# Patient Record
Sex: Female | Born: 1985
Health system: Southern US, Community
[De-identification: ages and names within clinical notes are randomized; demographics above are authoritative.]

## PROBLEM LIST (undated history)

## (undated) DIAGNOSIS — B009 Herpesviral infection, unspecified: Secondary | ICD-10-CM

## (undated) HISTORY — DX: Herpesviral infection, unspecified: B00.9

---

## 2003-05-23 ENCOUNTER — Emergency Department (HOSPITAL_COMMUNITY): Admission: EM | Admit: 2003-05-23 | Discharge: 2003-05-23 | Payer: Self-pay | Admitting: Emergency Medicine

## 2009-01-02 ENCOUNTER — Emergency Department (HOSPITAL_COMMUNITY): Admission: EM | Admit: 2009-01-02 | Discharge: 2009-01-02 | Payer: Self-pay | Admitting: Emergency Medicine

## 2010-07-21 LAB — POCT URINALYSIS DIP (DEVICE)
Bilirubin Urine: NEGATIVE
Glucose, UA: NEGATIVE mg/dL
Nitrite: NEGATIVE
Specific Gravity, Urine: 1.015 (ref 1.005–1.030)
Urobilinogen, UA: 0.2 mg/dL (ref 0.0–1.0)

## 2010-07-21 LAB — POCT PREGNANCY, URINE: Preg Test, Ur: NEGATIVE

## 2015-03-26 ENCOUNTER — Encounter (HOSPITAL_COMMUNITY): Payer: Self-pay

## 2015-03-26 ENCOUNTER — Emergency Department (HOSPITAL_COMMUNITY)
Admission: EM | Admit: 2015-03-26 | Discharge: 2015-03-26 | Disposition: A | Discharge status: Home / Self Care | Payer: BC Managed Care – PPO | Attending: Emergency Medicine | Admitting: Emergency Medicine

## 2015-03-26 DIAGNOSIS — S3992XA Unspecified injury of lower back, initial encounter: Secondary | ICD-10-CM | POA: Diagnosis present

## 2015-03-26 DIAGNOSIS — Y92410 Unspecified street and highway as the place of occurrence of the external cause: Secondary | ICD-10-CM | POA: Diagnosis not present

## 2015-03-26 DIAGNOSIS — Y9389 Activity, other specified: Secondary | ICD-10-CM | POA: Insufficient documentation

## 2015-03-26 DIAGNOSIS — Y998 Other external cause status: Secondary | ICD-10-CM | POA: Insufficient documentation

## 2015-03-26 DIAGNOSIS — S39012A Strain of muscle, fascia and tendon of lower back, initial encounter: Secondary | ICD-10-CM | POA: Diagnosis not present
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
## 2015-03-26 NOTE — Discharge Instructions (Signed)
Cryotherapy °Cryotherapy means treatment with cold. Ice or gel packs can be used to reduce both pain and swelling. Ice is the most helpful within the first 24 to 48 hours after an injury or flare-up from overusing a muscle or joint. Sprains, strains, spasms, burning pain, shooting pain, and aches can all be eased with ice. Ice can also be used when recovering from surgery. Ice is effective, has very few side effects, and is safe for most people to use. °PRECAUTIONS  °Ice is not a safe treatment option for people with: °· Raynaud phenomenon. This is a condition affecting small blood vessels in the extremities. Exposure to cold may cause your problems to return. °· Cold hypersensitivity. There are many forms of cold hypersensitivity, including: °· Cold urticaria. Red, itchy hives appear on the skin when the tissues begin to warm after being iced. °· Cold erythema. This is a red, itchy rash caused by exposure to cold. °· Cold hemoglobinuria. Red blood cells break down when the tissues begin to warm after being iced. The hemoglobin that carry oxygen are passed into the urine because they cannot combine with blood proteins fast enough. °· Numbness or altered sensitivity in the area being iced. °If you have any of the following conditions, do not use ice until you have discussed cryotherapy with your caregiver: °· Heart conditions, such as arrhythmia, angina, or chronic heart disease. °· High blood pressure. °· Healing wounds or open skin in the area being iced. °· Current infections. °· Rheumatoid arthritis. °· Poor circulation. °· Diabetes. °Ice slows the blood flow in the region it is applied. This is beneficial when trying to stop inflamed tissues from spreading irritating chemicals to surrounding tissues. However, if you expose your skin to cold temperatures for too long or without the proper protection, you can damage your skin or nerves. Watch for signs of skin damage due to cold. °HOME CARE INSTRUCTIONS °Follow  these tips to use ice and cold packs safely. °· Place a dry or damp towel between the ice and skin. A damp towel will cool the skin more quickly, so you may need to shorten the time that the ice is used. °· For a more rapid response, add gentle compression to the ice. °· Ice for no more than 10 to 20 minutes at a time. The bonier the area you are icing, the less time it will take to get the benefits of ice. °· Check your skin after 5 minutes to make sure there are no signs of a poor response to cold or skin damage. °· Rest 20 minutes or more between uses. °· Once your skin is numb, you can end your treatment. You can test numbness by very lightly touching your skin. The touch should be so light that you do not see the skin dimple from the pressure of your fingertip. When using ice, most people will feel these normal sensations in this order: cold, burning, aching, and numbness. °· Do not use ice on someone who cannot communicate their responses to pain, such as small children or people with dementia. °HOW TO MAKE AN ICE PACK °Ice packs are the most common way to use ice therapy. Other methods include ice massage, ice baths, and cryosprays. Muscle creams that cause a cold, tingly feeling do not offer the same benefits that ice offers and should not be used as a substitute unless recommended by your caregiver. °To make an ice pack, do one of the following: °· Place crushed ice or a   bag of frozen vegetables in a sealable plastic bag. Squeeze out the excess air. Place this bag inside another plastic bag. Slide the bag into a pillowcase or place a damp towel between your skin and the bag. °· Mix 3 parts water with 1 part rubbing alcohol. Freeze the mixture in a sealable plastic bag. When you remove the mixture from the freezer, it will be slushy. Squeeze out the excess air. Place this bag inside another plastic bag. Slide the bag into a pillowcase or place a damp towel between your skin and the bag. °SEEK MEDICAL CARE  IF: °· You develop white spots on your skin. This may give the skin a blotchy (mottled) appearance. °· Your skin turns blue or pale. °· Your skin becomes waxy or hard. °· Your swelling gets worse. °MAKE SURE YOU:  °· Understand these instructions. °· Will watch your condition. °· Will get help right away if you are not doing well or get worse. °  °This information is not intended to replace advice given to you by your health care provider. Make sure you discuss any questions you have with your health care provider. °  °Document Released: 11/27/2010 Document Revised: 04/23/2014 Document Reviewed: 11/27/2010 °Elsevier Interactive Patient Education ©2016 [employer] Inc. ° °Muscle Strain °A muscle strain is an injury that occurs when a muscle is stretched beyond its normal length. Usually a small number of muscle fibers are torn when this happens. Muscle strain is rated in degrees. First-degree strains have the least amount of muscle fiber tearing and pain. Second-degree and third-degree strains have increasingly more tearing and pain.  °Usually, recovery from muscle strain takes 1-2 weeks. Complete healing takes 5-6 weeks.  °CAUSES  °Muscle strain happens when a sudden, violent force placed on a muscle stretches it too far. This may occur with lifting, sports, or a fall.  °RISK FACTORS °Muscle strain is especially common in athletes.  °SIGNS AND SYMPTOMS °At the site of the muscle strain, there may be: °· Pain. °· Bruising. °· Swelling. °· Difficulty using the muscle due to pain or lack of normal function. °DIAGNOSIS  °Your health care provider will perform a physical exam and ask about your medical history. °TREATMENT  °Often, the best treatment for a muscle strain is resting, icing, and applying cold compresses to the injured area.   °HOME CARE INSTRUCTIONS  °· Use the PRICE method of treatment to promote muscle healing during the first 2-3 days after your injury. The PRICE method involves: °¨ Protecting the muscle  from being injured again. °¨ Restricting your activity and resting the injured body part. °¨ Icing your injury. To do this, put ice in a plastic bag. Place a towel between your skin and the bag. Then, apply the ice and leave it on from 15-20 minutes each hour. After the third day, switch to moist heat packs. °¨ Apply compression to the injured area with a splint or elastic bandage. Be careful not to wrap it too tightly. This may interfere with blood circulation or increase swelling. °¨ Elevate the injured body part above the level of your heart as often as you can. °· Only take over-the-counter or prescription medicines for pain, discomfort, or fever as directed by your health care provider. °· Warming up prior to exercise helps to prevent future muscle strains. °SEEK MEDICAL CARE IF:  °· You have increasing pain or swelling in the injured area. °· You have numbness, tingling, or a significant loss of strength in the injured area. °MAKE SURE   YOU:   Understand these instructions.  Will watch your condition.  Will get help right away if you are not doing well or get worse.   This information is not intended to replace advice given to you by your health care provider. Make sure you discuss any questions you have with your health care provider.   Document Released: 04/02/2005 Document Revised: 01/21/2013 Document Reviewed: 10/30/2012 Elsevier Interactive Patient Education 2016 [employer]  Tourist information centre managerMotor Vehicle Collision It is common to have multiple bruises and sore muscles after a motor vehicle collision (MVC). These tend to feel worse for the first 24 hours. You may have the most stiffness and soreness over the first several hours. You may also feel worse when you wake up the first morning after your collision. After this point, you will usually begin to improve with each day. The speed of improvement often depends on the severity of the collision, the number of injuries, and the location and nature of these  injuries. HOME CARE INSTRUCTIONS  Put ice on the injured area.  Put ice in a plastic bag.  Place a towel between your skin and the bag.  Leave the ice on for 15-20 minutes, 3-4 times a day, or as directed by your health care provider.  Drink enough fluids to keep your urine clear or pale yellow. Do not drink alcohol.  Take a warm shower or bath once or twice a day. This will increase blood flow to sore muscles.  You may return to activities as directed by your caregiver. Be careful when lifting, as this may aggravate neck or back pain.  Only take over-the-counter or prescription medicines for pain, discomfort, or fever as directed by your caregiver. Do not use aspirin. This may increase bruising and bleeding. SEEK IMMEDIATE MEDICAL CARE IF:  You have numbness, tingling, or weakness in the arms or legs.  You develop severe headaches not relieved with medicine.  You have severe neck pain, especially tenderness in the middle of the back of your neck.  You have changes in bowel or bladder control.  There is increasing pain in any area of the body.  You have shortness of breath, light-headedness, dizziness, or fainting.  You have chest pain.  You feel sick to your stomach (nauseous), throw up (vomit), or sweat.  You have increasing abdominal discomfort.  There is blood in your urine, stool, or vomit.  You have pain in your shoulder (shoulder strap areas).  You feel your symptoms are getting worse. MAKE SURE YOU:  Understand these instructions.  Will watch your condition.  Will get help right away if you are not doing well or get worse.   This information is not intended to replace advice given to you by your health care provider. Make sure you discuss any questions you have with your health care provider.   Document Released: 04/02/2005 Document Revised: 04/23/2014 Document Reviewed: 08/30/2010 Elsevier Interactive Patient Education 2016 Else[employer] Inc.  Lumbosacral  Strain Lumbosacral strain is a strain of any of the parts that make up your lumbosacral vertebrae. Your lumbosacral vertebrae are the bones that make up the lower third of your backbone. Your lumbosacral vertebrae are held together by muscles and tough, fibrous tissue (ligaments).  CAUSES  A sudden blow to your back can cause lumbosacral strain. Also, anything that causes an excessive stretch of the muscles in the low back can cause this strain. This is typically seen when people exert themselves strenuously, fall, lift heavy objects, bend, or crouch repeatedly.  RISK FACTORS  Physically demanding work.  Participation in pushing or pulling sports or sports that require a sudden twist of the back (tennis, golf, baseball).  Weight lifting.  Excessive lower back curvature.  Forward-tilted pelvis.  Weak back or abdominal muscles or both.  Tight hamstrings. SIGNS AND SYMPTOMS  Lumbosacral strain may cause pain in the area of your injury or pain that moves (radiates) down your leg.  DIAGNOSIS Your health care provider can often diagnose lumbosacral strain through a physical exam. In some cases, you may need tests such as X-ray exams.  TREATMENT  Treatment for your lower back injury depends on many factors that your clinician will have to evaluate. However, most treatment will include the use of anti-inflammatory medicines. HOME CARE INSTRUCTIONS   Avoid hard physical activities (tennis, racquetball, waterskiing) if you are not in proper physical condition for it. This may aggravate or create problems.  If you have a back problem, avoid sports requiring sudden body movements. Swimming and walking are generally safer activities.  Maintain good posture.  Maintain a healthy weight.  For acute conditions, you may put ice on the injured area.  Put ice in a plastic bag.  Place a towel between your skin and the bag.  Leave the ice on for 20 minutes, 2-3 times a day.  When the low back  starts healing, stretching and strengthening exercises may be recommended. SEEK MEDICAL CARE IF:  Your back pain is getting worse.  You experience severe back pain not relieved with medicines. SEEK IMMEDIATE MEDICAL CARE IF:   You have numbness, tingling, weakness, or problems with the use of your arms or legs.  There is a change in bowel or bladder control.  You have increasing pain in any area of the body, including your belly (abdomen).  You notice shortness of breath, dizziness, or feel faint.  You feel sick to your stomach (nauseous), are throwing up (vomiting), or become sweaty.  You notice discoloration of your toes or legs, or your feet get very cold. MAKE SURE YOU:   Understand these instructions.  Will watch your condition.  Will get help right away if you are not doing well or get worse.   This information is not intended to replace advice given to you by your health care provider. Make sure you discuss any questions you have with your health care provider.   Document Released: 01/10/2005 Document Revised: 04/23/2014 Document Reviewed: 11/19/2012 Elsevier Interactive Patient Education [employer]

## 2018-04-21 ENCOUNTER — Ambulatory Visit: Payer: BLUE CROSS/BLUE SHIELD | Admitting: Nurse Practitioner

## 2018-04-21 ENCOUNTER — Encounter: Payer: Self-pay | Admitting: Nurse Practitioner

## 2018-04-21 VITALS — BP 110/70 | HR 78 | Temp 98.2°F | Ht 63.6 in | Wt 159.8 lb

## 2018-04-21 DIAGNOSIS — Z833 Family history of diabetes mellitus: Secondary | ICD-10-CM

## 2018-04-21 DIAGNOSIS — Z8249 Family history of ischemic heart disease and other diseases of the circulatory system: Secondary | ICD-10-CM | POA: Diagnosis not present

## 2018-04-21 DIAGNOSIS — R21 Rash and other nonspecific skin eruption: Secondary | ICD-10-CM | POA: Diagnosis not present

## 2018-04-21 MED ORDER — DESONIDE 0.05 % EX CREA: TOPICAL_CREAM | Freq: Two times a day (BID) | CUTANEOUS | 3 refills | Status: DC

## 2018-04-21 NOTE — Progress Notes (Signed)
  Subjective:     Patient ID: Connie Snow , female    DOB: [birth year] , 33 y.o.   MRN: [id number]   Chief Complaint  Patient presents with  . Establish Care    patent would like to establish care here    HPI  Here to establish care - has been going to [facility] (yearly - March 2018) PAP yearly.  No birth control.  Single. Works as Systems analyst.  Went to [employer] Non-smoker and non drinker.    Father Type 2 DM, Mother - had HTN. Identical twin sister - palpitations. 2 sisters and 2 brothers (healthy).    Tore her left quad last year Dr. Althea Charon  Rash  This is a recurrent problem. Location: right hand and creases of elbow. The rash is characterized by redness and blistering. She was exposed to nothing. Pertinent negatives include no cough, fatigue or fever. Past treatments include nothing.     Past Medical History:  Diagnosis Date  . HSV-1 (herpes simplex virus 1) infection      Family History  Problem Relation Age of Onset  . Hypertension Mother   . Diabetes Father      Current Outpatient Medications:  .  valACYclovir (VALTREX) 1000 MG tablet, Take 500 mg by mouth daily. As needed, Disp: , Rfl:    No Known Allergies   Review of Systems  Constitutional: Negative for fatigue and fever.  Eyes: Negative.   Respiratory: Negative for cough.   Cardiovascular: Negative.   Gastrointestinal: Negative.   Endocrine: Negative for polydipsia, polyphagia and polyuria.  Musculoskeletal: Negative.   Skin: Positive for rash.  Neurological: Negative.   Hematological: Negative.     Today's Vitals   04/21/18 1127  BP: 110/70  Pulse: 78  Temp: 98.2 F (36.8 C)  TempSrc: Oral  SpO2: 97%  Weight: 159 lb 12.8 oz (72.5 kg)  Height: 5' 3.6" (1.615 m)  PainSc: 0-No pain   Body mass index is 27.78 kg/m.   Objective:  Physical Exam Vitals signs reviewed.  Constitutional:      Appearance: Normal appearance.  Cardiovascular:     Rate and Rhythm: Normal rate and  regular rhythm.     Pulses: Normal pulses.     Heart sounds: Normal heart sounds. No murmur.  Pulmonary:     Effort: Pulmonary effort is normal. No respiratory distress.     Breath sounds: Normal breath sounds.  Skin:    General: Skin is warm and dry.  Neurological:     General: No focal deficit present.     Mental Status: She is alert and oriented to person, place, and time.  Psychiatric:        Mood and Affect: Mood normal.         Assessment And Plan:     1. Rash and nonspecific skin eruption  None currently, suspect this is eczema to the creases of her arms and her posterior hands.       Arnette Felts, FNP

## 2018-04-21 NOTE — Patient Instructions (Signed)

## 2018-05-26 ENCOUNTER — Ambulatory Visit: Payer: BLUE CROSS/BLUE SHIELD | Admitting: Nurse Practitioner

## 2018-05-26 ENCOUNTER — Encounter: Payer: Self-pay | Admitting: Nurse Practitioner

## 2018-05-26 VITALS — BP 118/70 | HR 68 | Temp 97.5°F | Ht 66.2 in | Wt 161.0 lb

## 2018-05-26 DIAGNOSIS — H6123 Impacted cerumen, bilateral: Secondary | ICD-10-CM | POA: Insufficient documentation

## 2018-05-26 DIAGNOSIS — Z0000 Encounter for general adult medical examination without abnormal findings: Secondary | ICD-10-CM

## 2018-05-26 LAB — POCT URINALYSIS DIPSTICK
Bilirubin, UA: NEGATIVE
Blood, UA: NEGATIVE
Glucose, UA: NEGATIVE
KETONES UA: NEGATIVE
Leukocytes, UA: NEGATIVE
Nitrite, UA: NEGATIVE
Protein, UA: NEGATIVE
Spec Grav, UA: 1.02 (ref 1.010–1.025)
Urobilinogen, UA: 0.2 E.U./dL
pH, UA: 8.5 — AB (ref 5.0–8.0)

## 2018-05-26 NOTE — Progress Notes (Signed)
Subjective:     Patient ID: Connie Snow , female    DOB: 22-Dec-1985 , 33 y.o.   MRN: [id number]   Chief Complaint  Patient presents with  . Annual Exam   The patient states she uses none for birth control. Last LMP was Patient's last menstrual period was 05/16/2018.. Negative for Dysmenorrhea and Negative for Menorrhagia Mammogram last done 2019, family history of breast cancer maternal grandmother and paternal aunt. Negative for: breast discharge, breast lump(s), breast pain and breast self exam.  Pertinent negatives include abnormal bleeding (hematology), anxiety, decreased libido, depression, difficulty falling sleep, dyspareunia, history of infertility, nocturia, sexual dysfunction, sleep disturbances, urinary incontinence, urinary urgency, vaginal discharge and vaginal itching. Diet clean eating. The patient states her exercise level is   6 days per week.     The patient's tobacco use is:   Social History   Tobacco Use  Smoking Status Never Smoker  Smokeless Tobacco Never Used   The has been exposed to passive smoke. The patient's alcohol use is:   Social History   Substance and Sexual Activity  Alcohol Use No   Additional information: Last pap 2019, next one scheduled for 2020   HPI  HPI   Past Medical History:  Diagnosis Date  . HSV-1 (herpes simplex virus 1) infection      Family History  Problem Relation Age of Onset  . Hypertension Mother   . Diabetes Father      Current Outpatient Medications:  .  desonide (DESOWEN) 0.05 % cream, Apply topically 2 (two) times daily., Disp: 30 g, Rfl: 3 .  valACYclovir (VALTREX) 1000 MG tablet, Take 500 mg by mouth daily. As needed, Disp: , Rfl:    No Known Allergies   Review of Systems  Constitutional: Negative.  Negative for fatigue.  HENT: Negative.   Eyes: Negative.   Respiratory: Negative.   Cardiovascular: Negative.  Negative for chest pain.  Gastrointestinal: Negative.   Endocrine: Negative.    Genitourinary: Negative.   Musculoskeletal: Negative.   Skin: Negative.   Allergic/Immunologic: Negative.   Neurological: Negative.   Hematological: Negative.   Psychiatric/Behavioral: Negative.      Today's Vitals   05/26/18 0957  BP: 118/70  Pulse: 68  Temp: (!) 97.5 F (36.4 C)  TempSrc: Oral  Weight: 161 lb (73 kg)  Height: 5' 6.2" (1.681 m)  PainSc: 0-No pain   Body mass index is 25.83 kg/m.   Objective:  Physical Exam Constitutional:      Appearance: Normal appearance. She is well-developed.  HENT:     Head: Normocephalic and atraumatic.     Right Ear: Hearing, tympanic membrane, ear canal and external ear normal.     Left Ear: Hearing, tympanic membrane, ear canal and external ear normal.     Nose: Nose normal.     Mouth/Throat:     Mouth: Mucous membranes are moist.  Eyes:     General: Lids are normal.     Conjunctiva/sclera: Conjunctivae normal.     Pupils: Pupils are equal, round, and reactive to light.     Funduscopic exam:    Right eye: No papilledema.        Left eye: No papilledema.  Neck:     Musculoskeletal: Full passive range of motion without pain, normal range of motion and neck supple.     Thyroid: No thyroid mass.     Vascular: No carotid bruit.  Cardiovascular:     Rate and Rhythm: Normal rate and regular rhythm.  Pulses: Normal pulses.     Heart sounds: Normal heart sounds. No murmur.  Pulmonary:     Effort: Pulmonary effort is normal.     Breath sounds: Normal breath sounds.  Abdominal:     General: Abdomen is flat. Bowel sounds are normal.     Palpations: Abdomen is soft.  Musculoskeletal: Normal range of motion.        General: No swelling.     Right lower leg: No edema.     Left lower leg: No edema.  Skin:    General: Skin is warm and dry.     Capillary Refill: Capillary refill takes less than 2 seconds.  Neurological:     General: No focal deficit present.     Mental Status: She is alert and oriented to person, place,  and time.     Cranial Nerves: No cranial nerve deficit.     Sensory: No sensory deficit.  Psychiatric:        Mood and Affect: Mood normal.        Behavior: Behavior normal.        Thought Content: Thought content normal.        Judgment: Judgment normal.         Assessment And Plan:    1. Encounter for general adult medical examination w/o abnormal findings . Behavior modifications discussed and diet history reviewed.   . Pt will continue to exercise regularly and modify diet with low GI, plant based foods and decrease intake of processed foods.  . Recommend intake of daily multivitamin, Vitamin D, and calcium.  . Recommend mammogram (done in 2019 due to family history) for preventive screenings, as well as recommend immunizations that include influenza (declined), TDAP (she is to find out when her last tetanus was given and provide a copy) . She will be checked for HIV at her GYN in March  - POCT Urinalysis Dipstick (81002)  2. Bilateral impacted cerumen  Removed cerumen with lighted curette  Encouraged to use 1/2 water and 1/2 peroxide once a week as needed   Increase fish intake or fish oil supplement daily.       Arnette Felts, FNP

## 2018-05-26 NOTE — Patient Instructions (Addendum)

## 2018-05-27 DIAGNOSIS — H6123 Impacted cerumen, bilateral: Secondary | ICD-10-CM | POA: Diagnosis not present

## 2018-05-27 LAB — CMP14 + ANION GAP
A/G RATIO: 1.8 (ref 1.2–2.2)
ALK PHOS: 33 IU/L — AB (ref 39–117)
ALT: 23 IU/L (ref 0–32)
AST: 24 IU/L (ref 0–40)
Albumin: 4.6 g/dL (ref 3.8–4.8)
Anion Gap: 14 mmol/L (ref 10.0–18.0)
BILIRUBIN TOTAL: 0.4 mg/dL (ref 0.0–1.2)
BUN/Creatinine Ratio: 11 (ref 9–23)
BUN: 9 mg/dL (ref 6–20)
CALCIUM: 9.9 mg/dL (ref 8.7–10.2)
CHLORIDE: 101 mmol/L (ref 96–106)
CO2: 24 mmol/L (ref 20–29)
Creatinine, Ser: 0.81 mg/dL (ref 0.57–1.00)
GFR calc Af Amer: 109 mL/min/{1.73_m2} (ref >59)
GFR, EST NON AFRICAN AMERICAN: 94 mL/min/{1.73_m2} (ref >59)
GLOBULIN, TOTAL: 2.6 g/dL (ref 1.5–4.5)
GLUCOSE: 85 mg/dL (ref 65–99)
Potassium: 4.6 mmol/L (ref 3.5–5.2)
SODIUM: 139 mmol/L (ref 134–144)
Total Protein: 7.2 g/dL (ref 6.0–8.5)

## 2018-05-27 LAB — CBC
HEMATOCRIT: 37.3 % (ref 34.0–46.6)
Hemoglobin: 12.5 g/dL (ref 11.1–15.9)
MCH: 31.3 pg (ref 26.6–33.0)
MCHC: 33.5 g/dL (ref 31.5–35.7)
MCV: 93 fL (ref 79–97)
Platelets: 222 10*3/uL (ref 150–450)
RBC: 4 x10E6/uL (ref 3.77–5.28)
RDW: 12.2 % (ref 11.7–15.4)
WBC: 9.1 10*3/uL (ref 3.4–10.8)

## 2018-05-27 LAB — LIPID PANEL
CHOL/HDL RATIO: 1.5 ratio (ref 0.0–4.4)
Cholesterol, Total: 95 mg/dL — ABNORMAL LOW (ref 100–199)
HDL: 63 mg/dL (ref >39)
LDL Calculated: 25 mg/dL (ref 0–99)
Triglycerides: 34 mg/dL (ref 0–149)
VLDL Cholesterol Cal: 7 mg/dL (ref 5–40)

## 2018-06-10 ENCOUNTER — Encounter: Payer: Self-pay | Admitting: Nurse Practitioner

## 2018-07-03 ENCOUNTER — Encounter: Payer: Self-pay | Admitting: Nurse Practitioner

## 2018-07-28 DIAGNOSIS — S83501A Sprain of unspecified cruciate ligament of right knee, initial encounter: Secondary | ICD-10-CM | POA: Diagnosis not present

## 2018-08-21 DIAGNOSIS — Z6825 Body mass index (BMI) 25.0-25.9, adult: Secondary | ICD-10-CM | POA: Diagnosis not present

## 2018-08-21 DIAGNOSIS — Z01419 Encounter for gynecological examination (general) (routine) without abnormal findings: Secondary | ICD-10-CM | POA: Diagnosis not present

## 2018-08-21 DIAGNOSIS — Z113 Encounter for screening for infections with a predominantly sexual mode of transmission: Secondary | ICD-10-CM | POA: Diagnosis not present

## 2018-08-21 DIAGNOSIS — A609 Anogenital herpesviral infection, unspecified: Secondary | ICD-10-CM | POA: Diagnosis not present

## 2018-08-28 LAB — HM PAP SMEAR

## 2018-09-23 DIAGNOSIS — H6123 Impacted cerumen, bilateral: Secondary | ICD-10-CM | POA: Diagnosis not present

## 2018-09-23 DIAGNOSIS — H900 Conductive hearing loss, bilateral: Secondary | ICD-10-CM | POA: Diagnosis not present

## 2018-12-30 DIAGNOSIS — Z113 Encounter for screening for infections with a predominantly sexual mode of transmission: Secondary | ICD-10-CM | POA: Diagnosis not present

## 2018-12-30 DIAGNOSIS — N760 Acute vaginitis: Secondary | ICD-10-CM | POA: Diagnosis not present

## 2018-12-30 LAB — HM HIV SCREENING LAB: HM HIV Screening: NEGATIVE

## 2019-04-06 ENCOUNTER — Ambulatory Visit: Payer: BC Managed Care – PPO | Attending: Internal Medicine

## 2019-04-06 DIAGNOSIS — R238 Other skin changes: Secondary | ICD-10-CM | POA: Diagnosis not present

## 2019-04-06 DIAGNOSIS — U071 COVID-19: Secondary | ICD-10-CM

## 2019-04-07 LAB — NOVEL CORONAVIRUS, NAA: SARS-CoV-2, NAA: NOT DETECTED

## 2019-05-12 DIAGNOSIS — H6123 Impacted cerumen, bilateral: Secondary | ICD-10-CM | POA: Diagnosis not present

## 2019-05-18 ENCOUNTER — Ambulatory Visit: Payer: BC Managed Care – PPO | Attending: Internal Medicine

## 2019-05-18 DIAGNOSIS — Z20822 Contact with and (suspected) exposure to covid-19: Secondary | ICD-10-CM | POA: Diagnosis not present

## 2019-05-19 LAB — NOVEL CORONAVIRUS, NAA: SARS-CoV-2, NAA: NOT DETECTED

## 2019-06-01 ENCOUNTER — Ambulatory Visit (INDEPENDENT_AMBULATORY_CARE_PROVIDER_SITE_OTHER): Payer: BC Managed Care – PPO | Admitting: Nurse Practitioner

## 2019-06-01 ENCOUNTER — Other Ambulatory Visit: Payer: Self-pay

## 2019-06-01 ENCOUNTER — Encounter: Payer: Self-pay | Admitting: Nurse Practitioner

## 2019-06-01 VITALS — BP 112/80 | HR 75 | Temp 98.2°F | Ht 66.2 in | Wt 165.6 lb

## 2019-06-01 DIAGNOSIS — Z0000 Encounter for general adult medical examination without abnormal findings: Secondary | ICD-10-CM

## 2019-06-01 DIAGNOSIS — Z23 Encounter for immunization: Secondary | ICD-10-CM | POA: Diagnosis not present

## 2019-06-01 LAB — POCT URINALYSIS DIPSTICK
Bilirubin, UA: NEGATIVE
Glucose, UA: NEGATIVE
Ketones, UA: NEGATIVE
Leukocytes, UA: NEGATIVE
Nitrite, UA: NEGATIVE
Protein, UA: NEGATIVE
Spec Grav, UA: 1.025 (ref 1.010–1.025)
Urobilinogen, UA: 0.2 E.U./dL
pH, UA: 7 (ref 5.0–8.0)

## 2019-06-01 MED ORDER — TETANUS-DIPHTH-ACELL PERTUSSIS 5-2.5-18.5 LF-MCG/0.5 IM SUSP
0.5000 mL | Freq: Once | INTRAMUSCULAR | Status: AC
  Administered 2019-06-01: 10:00:00 0.5 mL via INTRAMUSCULAR

## 2019-06-01 NOTE — Progress Notes (Signed)
Subjective:     Patient ID: Connie Snow , female    DOB: 11/06/1985 , 34 y.o.   MRN: [id number]   Chief Complaint  Patient presents with  . Annual Exam   The patient states she uses none for birth control. Last LMP was Patient's last menstrual period was 05/31/2019.. Negative for Dysmenorrhea and Negative for Menorrhagia Mammogram last done 2019, family history of breast cancer maternal grandmother and paternal aunt. Negative for: breast discharge, breast lump(s), breast pain and breast self exam.  Pertinent negatives include abnormal bleeding (hematology), anxiety, decreased libido, depression, difficulty falling sleep, dyspareunia, history of infertility, nocturia, sexual dysfunction, sleep disturbances, urinary incontinence, urinary urgency, vaginal discharge and vaginal itching. Diet clean eating. The patient states her exercise level is   6 days per week.     The patient's tobacco use is:   Social History   Tobacco Use  Smoking Status Never Smoker  Smokeless Tobacco Never Used   The has been exposed to passive smoke. The patient's alcohol use is:   Social History   Substance and Sexual Activity  Alcohol Use No   Additional information: Last pap 2019, next one scheduled for 2020   HPI  Here for HM - she is taking a liquid multivitamin, she has not received the covid vaccine.      Past Medical History:  Diagnosis Date  . HSV-1 (herpes simplex virus 1) infection      Family History  Problem Relation Age of Onset  . Hypertension Mother   . Diabetes Father      Current Outpatient Medications:  .  desonide (DESOWEN) 0.05 % cream, Apply topically 2 (two) times daily., Disp: 30 g, Rfl: 3 .  valACYclovir (VALTREX) 1000 MG tablet, Take 500 mg by mouth daily. As needed, Disp: , Rfl:    No Known Allergies   Review of Systems  Constitutional: Negative.  Negative for fatigue.  HENT: Negative.   Eyes: Negative.   Respiratory: Negative.   Cardiovascular: Negative.   Negative for chest pain.  Gastrointestinal: Negative.   Endocrine: Negative.   Genitourinary: Negative.   Musculoskeletal: Negative.   Skin: Negative.   Allergic/Immunologic: Negative.   Neurological: Negative.   Hematological: Negative.   Psychiatric/Behavioral: Negative.      Today's Vitals   06/01/19 0941  BP: 112/80  Pulse: 75  Temp: 98.2 F (36.8 C)  TempSrc: Oral  Weight: 165 lb 9.6 oz (75.1 kg)  Height: 5' 6.2" (1.681 m)  PainSc: 0-No pain   Body mass index is 26.57 kg/m.   Objective:  Physical Exam Constitutional:      Appearance: Normal appearance. She is well-developed.  HENT:     Head: Normocephalic and atraumatic.     Right Ear: Hearing, tympanic membrane, ear canal and external ear normal. There is no impacted cerumen.     Left Ear: Hearing, tympanic membrane, ear canal and external ear normal. There is no impacted cerumen.  Eyes:     General: Lids are normal.     Extraocular Movements: Extraocular movements intact.     Conjunctiva/sclera: Conjunctivae normal.     Funduscopic exam:    Right eye: No papilledema.        Left eye: No papilledema.  Neck:     Thyroid: No thyroid mass.     Vascular: No carotid bruit.  Cardiovascular:     Rate and Rhythm: Normal rate and regular rhythm.     Pulses: Normal pulses.     Heart sounds: Normal   heart sounds. No murmur.  Pulmonary:     Effort: Pulmonary effort is normal.     Breath sounds: Normal breath sounds. No wheezing.  Abdominal:     General: Abdomen is flat. Bowel sounds are normal.     Palpations: Abdomen is soft.  Musculoskeletal:        General: No swelling or tenderness. Normal range of motion.     Cervical back: Full passive range of motion without pain, normal range of motion and neck supple.     Right lower leg: No edema.     Left lower leg: No edema.     Comments: Left achilles with tape due to possible achilles tendonitis.  Skin:    General: Skin is warm and dry.     Capillary Refill:  Capillary refill takes less than 2 seconds.  Neurological:     General: No focal deficit present.     Mental Status: She is alert and oriented to person, place, and time.     Cranial Nerves: No cranial nerve deficit.     Sensory: No sensory deficit.  Psychiatric:        Mood and Affect: Mood normal.        Behavior: Behavior normal.        Thought Content: Thought content normal.        Judgment: Judgment normal.         Assessment And Plan:    1. Encounter for general adult medical examination w/o abnormal findings . Behavior modifications discussed and diet history reviewed.   . Pt will continue to exercise regularly and modify diet with low GI, plant based foods and decrease intake of processed foods.  . Recommend intake of daily multivitamin, Vitamin D, and calcium.  . Recommend mammogram (done in 2019 due to family history) for preventive screenings, as well as recommend immunizations that include influenza (declined), TDAP (she is to find out when her last tetanus was given and provide a copy) . She will be checked for HIV at her GYN in March - POCT Urinalysis Dipstick (81002) - Lipid panel - CMP14+EGFR - CBC no Diff - Hemoglobin A1c - Vitamin D (25 hydroxy)  2. Encounter for immunization  Will give tetanus vaccine today while in office. Refer to order management. TDAP will be administered to adults 18-64 years old every 10 years. - Tdap (BOOSTRIX) injection 0.5 mL        Janece Moore, FNP  

## 2019-06-01 NOTE — Patient Instructions (Signed)
Health Maintenance  Topic Date Due  . HIV Screening  05/22/1998  . TETANUS/TDAP  05/22/2002  . INFLUENZA VACCINE  07/15/2019 (Originally 11/15/2018)  . PAP SMEAR-Modifier  08/12/2020   Health Maintenance, Female Adopting a healthy lifestyle and getting preventive care are important in promoting health and wellness. Ask your health care provider about:  The right schedule for you to have regular tests and exams.  Things you can do on your own to prevent diseases and keep yourself healthy. What should I know about diet, weight, and exercise? Eat a healthy diet   Eat a diet that includes plenty of vegetables, fruits, low-fat dairy products, and lean protein.  Do not eat a lot of foods that are high in solid fats, added sugars, or sodium. Maintain a healthy weight Body mass index (BMI) is used to identify weight problems. It estimates body fat based on height and weight. Your health care provider can help determine your BMI and help you achieve or maintain a healthy weight. Get regular exercise Get regular exercise. This is one of the most important things you can do for your health. Most adults should:  Exercise for at least 150 minutes each week. The exercise should increase your heart rate and make you sweat (moderate-intensity exercise).  Do strengthening exercises at least twice a week. This is in addition to the moderate-intensity exercise.  Spend less time sitting. Even light physical activity can be beneficial. Watch cholesterol and blood lipids Have your blood tested for lipids and cholesterol at 36 years of age, then have this test every 5 years. Have your cholesterol levels checked more often if:  Your lipid or cholesterol levels are high.  You are older than 36 years of age.  You are at high risk for heart disease. What should I know about cancer screening? Depending on your health history and family history, you may need to have cancer screening at various ages. This  may include screening for:  Breast cancer.  Cervical cancer.  Colorectal cancer.  Skin cancer.  Lung cancer. What should I know about heart disease, diabetes, and high blood pressure? Blood pressure and heart disease  High blood pressure causes heart disease and increases the risk of stroke. This is more likely to develop in people who have high blood pressure readings, are of African descent, or are overweight.  Have your blood pressure checked: ? Every 3-5 years if you are 47-50 years of age. ? Every year if you are 46 years old or older. Diabetes Have regular diabetes screenings. This checks your fasting blood sugar level. Have the screening done:  Once every three years after age 55 if you are at a normal weight and have a low risk for diabetes.  More often and at a younger age if you are overweight or have a high risk for diabetes. What should I know about preventing infection? Hepatitis B If you have a higher risk for hepatitis B, you should be screened for this virus. Talk with your health care provider to find out if you are at risk for hepatitis B infection. Hepatitis C Testing is recommended for:  Everyone born from [birth year] through 1967.  Anyone with known risk factors for hepatitis C. Sexually transmitted infections (STIs)  Get screened for STIs, including gonorrhea and chlamydia, if: ? You are sexually active and are younger than 36 years of age. ? You are older than 36 years of age and your health care provider tells you that you are at  risk for this type of infection. ? Your sexual activity has changed since you were last screened, and you are at increased risk for chlamydia or gonorrhea. Ask your health care provider if you are at risk.  Ask your health care provider about whether you are at high risk for HIV. Your health care provider may recommend a prescription medicine to help prevent HIV infection. If you choose to take medicine to prevent HIV, you should  first get tested for HIV. You should then be tested every 3 months for as long as you are taking the medicine. Pregnancy  If you are about to stop having your period (premenopausal) and you may become pregnant, seek counseling before you get pregnant.  Take 400 to 800 micrograms (mcg) of folic acid every day if you become pregnant.  Ask for birth control (contraception) if you want to prevent pregnancy. Osteoporosis and menopause Osteoporosis is a disease in which the bones lose minerals and strength with aging. This can result in bone fractures. If you are 36 years old or older, or if you are at risk for osteoporosis and fractures, ask your health care provider if you should:  Be screened for bone loss.  Take a calcium or vitamin D supplement to lower your risk of fractures.  Be given hormone replacement therapy (HRT) to treat symptoms of menopause. Follow these instructions at home: Lifestyle  Do not use any products that contain nicotine or tobacco, such as cigarettes, e-cigarettes, and chewing tobacco. If you need help quitting, ask your health care provider.  Do not use street drugs.  Do not share needles.  Ask your health care provider for help if you need support or information about quitting drugs. Alcohol use  Do not drink alcohol if: ? Your health care provider tells you not to drink. ? You are pregnant, may be pregnant, or are planning to become pregnant.  If you drink alcohol: ? Limit how much you use to 0-1 drink a day. ? Limit intake if you are breastfeeding.  Be aware of how much alcohol is in your drink. In the [place] one drink equals one 12 oz bottle of beer (355 mL), one 5 oz glass of wine (148 mL), or one 1 oz glass of hard liquor (44 mL). General instructions  Schedule regular health, dental, and eye exams.  Stay current with your vaccines.  Tell your health care provider if: ? You often feel depressed. ? You have ever been abused or do not feel safe  at home. Summary  Adopting a healthy lifestyle and getting preventive care are important in promoting health and wellness.  Follow your health care provider's instructions about healthy diet, exercising, and getting tested or screened for diseases.  Follow your health care provider's instructions on monitoring your cholesterol and blood pressure. This information is not intended to replace advice given to you by your health care provider. Make sure you discuss any questions you have with your health care provider. Document Revised: 03/26/2018 Document Reviewed: 03/26/2018 Elsevier Patient Education  2020 [employer].

## 2019-06-02 LAB — CMP14+EGFR
ALT: 19 IU/L (ref 0–32)
AST: 23 IU/L (ref 0–40)
Albumin/Globulin Ratio: 1.4 (ref 1.2–2.2)
Albumin: 4.1 g/dL (ref 3.8–4.8)
Alkaline Phosphatase: 33 IU/L — ABNORMAL LOW (ref 39–117)
BUN/Creatinine Ratio: 10 (ref 9–23)
BUN: 8 mg/dL (ref 6–20)
Bilirubin Total: 0.3 mg/dL (ref 0.0–1.2)
CO2: 24 mmol/L (ref 20–29)
Calcium: 9.4 mg/dL (ref 8.7–10.2)
Chloride: 105 mmol/L (ref 96–106)
Creatinine, Ser: 0.84 mg/dL (ref 0.57–1.00)
GFR calc Af Amer: 103 mL/min/{1.73_m2} (ref >59)
GFR calc non Af Amer: 90 mL/min/{1.73_m2} (ref >59)
Globulin, Total: 2.9 g/dL (ref 1.5–4.5)
Glucose: 78 mg/dL (ref 65–99)
Potassium: 3.9 mmol/L (ref 3.5–5.2)
Sodium: 141 mmol/L (ref 134–144)
Total Protein: 7 g/dL (ref 6.0–8.5)

## 2019-06-02 LAB — CBC
Hematocrit: 36.1 % (ref 34.0–46.6)
Hemoglobin: 12.4 g/dL (ref 11.1–15.9)
MCH: 31.6 pg (ref 26.6–33.0)
MCHC: 34.3 g/dL (ref 31.5–35.7)
MCV: 92 fL (ref 79–97)
Platelets: 214 10*3/uL (ref 150–450)
RBC: 3.92 x10E6/uL (ref 3.77–5.28)
RDW: 12.4 % (ref 11.7–15.4)
WBC: 7 10*3/uL (ref 3.4–10.8)

## 2019-06-02 LAB — LIPID PANEL
Chol/HDL Ratio: 1.5 ratio (ref 0.0–4.4)
Cholesterol, Total: 89 mg/dL — ABNORMAL LOW (ref 100–199)
HDL: 60 mg/dL (ref >39)
LDL Chol Calc (NIH): 18 mg/dL (ref 0–99)
Triglycerides: 37 mg/dL (ref 0–149)
VLDL Cholesterol Cal: 11 mg/dL (ref 5–40)

## 2019-06-02 LAB — VITAMIN D 25 HYDROXY (VIT D DEFICIENCY, FRACTURES): Vit D, 25-Hydroxy: 38.7 ng/mL (ref 30.0–100.0)

## 2019-06-02 LAB — HEMOGLOBIN A1C
Est. average glucose Bld gHb Est-mCnc: 105 mg/dL
Hgb A1c MFr Bld: 5.3 % (ref 4.8–5.6)

## 2019-06-04 ENCOUNTER — Telehealth: Payer: Self-pay

## 2019-06-04 NOTE — Telephone Encounter (Signed)
LEFT VM FOR PT TO CALL BACK FOR LABS OR VIEW THEM ON MYCHART

## 2019-06-04 NOTE — Telephone Encounter (Signed)
-----   Message from Arnette Felts, FNP sent at 06/04/2019 11:38 AM EST ----- All of your labs are essentially normal.

## 2019-08-27 DIAGNOSIS — Z6825 Body mass index (BMI) 25.0-25.9, adult: Secondary | ICD-10-CM | POA: Diagnosis not present

## 2019-08-27 DIAGNOSIS — Z01419 Encounter for gynecological examination (general) (routine) without abnormal findings: Secondary | ICD-10-CM | POA: Diagnosis not present

## 2019-08-27 LAB — HM PAP SMEAR

## 2019-09-03 DIAGNOSIS — D259 Leiomyoma of uterus, unspecified: Secondary | ICD-10-CM | POA: Diagnosis not present

## 2019-09-03 DIAGNOSIS — R1031 Right lower quadrant pain: Secondary | ICD-10-CM | POA: Diagnosis not present

## 2019-10-06 DIAGNOSIS — N840 Polyp of corpus uteri: Secondary | ICD-10-CM | POA: Diagnosis not present

## 2019-11-10 DIAGNOSIS — H6123 Impacted cerumen, bilateral: Secondary | ICD-10-CM | POA: Diagnosis not present

## 2020-02-09 DIAGNOSIS — H6123 Impacted cerumen, bilateral: Secondary | ICD-10-CM | POA: Diagnosis not present

## 2020-03-23 DIAGNOSIS — R1031 Right lower quadrant pain: Secondary | ICD-10-CM | POA: Diagnosis not present

## 2020-03-23 DIAGNOSIS — Z3202 Encounter for pregnancy test, result negative: Secondary | ICD-10-CM | POA: Diagnosis not present

## 2020-03-25 DIAGNOSIS — R1031 Right lower quadrant pain: Secondary | ICD-10-CM | POA: Diagnosis not present

## 2020-04-02 ENCOUNTER — Ambulatory Visit: Payer: BC Managed Care – PPO | Attending: Internal Medicine

## 2020-04-02 DIAGNOSIS — Z23 Encounter for immunization: Secondary | ICD-10-CM

## 2020-04-02 NOTE — Progress Notes (Signed)
° °  Covid-19 Vaccination Clinic  Name:  Connie Snow    MRN: [id number] DOB: 11-Sep-1985  04/02/2020  [name] was observed post Covid-19 immunization for 15 minutes without incident. She was provided with Vaccine Information Sheet and instruction to access the V-Safe system.   [name] was instructed to call 911 with any severe reactions post vaccine:  Difficulty breathing   Swelling of face and throat   A fast heartbeat   A bad rash all over body   Dizziness and weakness   Immunizations Administered    Name Date Dose VIS Date Route   Pfizer COVID-19 Vaccine 04/02/2020 12:16 PM 0.3 mL 02/03/2020 Intramuscular   Manufacturer: [employer], [employer]   Lot: [id number]   NDC: [address]-5

## 2020-04-25 ENCOUNTER — Other Ambulatory Visit: Payer: BC Managed Care – PPO

## 2020-06-01 ENCOUNTER — Encounter: Payer: BC Managed Care – PPO | Admitting: Nurse Practitioner

## 2020-06-13 ENCOUNTER — Other Ambulatory Visit: Payer: Self-pay | Admitting: Gastroenterology

## 2020-06-13 DIAGNOSIS — R1011 Right upper quadrant pain: Secondary | ICD-10-CM

## 2020-06-13 DIAGNOSIS — R109 Unspecified abdominal pain: Secondary | ICD-10-CM | POA: Diagnosis not present

## 2020-06-15 ENCOUNTER — Encounter: Payer: BC Managed Care – PPO | Admitting: Nurse Practitioner

## 2020-06-27 ENCOUNTER — Ambulatory Visit
Admission: RE | Admit: 2020-06-27 | Discharge: 2020-06-27 | Disposition: A | Discharge status: Home / Self Care | Payer: BC Managed Care – PPO | Source: Ambulatory Visit | Attending: Gastroenterology | Admitting: Gastroenterology

## 2020-06-27 DIAGNOSIS — R1011 Right upper quadrant pain: Secondary | ICD-10-CM

## 2020-06-29 ENCOUNTER — Other Ambulatory Visit: Payer: Self-pay | Admitting: Gastroenterology

## 2020-06-29 DIAGNOSIS — K6289 Other specified diseases of anus and rectum: Secondary | ICD-10-CM

## 2020-06-29 DIAGNOSIS — R1010 Upper abdominal pain, unspecified: Secondary | ICD-10-CM

## 2020-07-12 DIAGNOSIS — M25561 Pain in right knee: Secondary | ICD-10-CM | POA: Diagnosis not present

## 2020-07-12 DIAGNOSIS — F4320 Adjustment disorder, unspecified: Secondary | ICD-10-CM | POA: Diagnosis not present

## 2020-07-14 ENCOUNTER — Ambulatory Visit
Admission: RE | Admit: 2020-07-14 | Discharge: 2020-07-14 | Disposition: A | Discharge status: Home / Self Care | Payer: BC Managed Care – PPO | Source: Ambulatory Visit | Attending: Gastroenterology | Admitting: Gastroenterology

## 2020-07-14 DIAGNOSIS — R1010 Upper abdominal pain, unspecified: Secondary | ICD-10-CM

## 2020-07-14 DIAGNOSIS — K6289 Other specified diseases of anus and rectum: Secondary | ICD-10-CM

## 2020-07-14 DIAGNOSIS — D259 Leiomyoma of uterus, unspecified: Secondary | ICD-10-CM | POA: Diagnosis not present

## 2020-07-14 DIAGNOSIS — Z8719 Personal history of other diseases of the digestive system: Secondary | ICD-10-CM | POA: Diagnosis not present

## 2020-07-14 MED ORDER — IOPAMIDOL (ISOVUE-300) INJECTION 61%
100.0000 mL | Freq: Once | INTRAVENOUS | Status: AC | PRN
  Administered 2020-07-14: 100 mL via INTRAVENOUS

## 2020-07-16 DIAGNOSIS — M25561 Pain in right knee: Secondary | ICD-10-CM | POA: Diagnosis not present

## 2020-07-26 DIAGNOSIS — F4320 Adjustment disorder, unspecified: Secondary | ICD-10-CM | POA: Diagnosis not present

## 2020-08-09 DIAGNOSIS — F4320 Adjustment disorder, unspecified: Secondary | ICD-10-CM | POA: Diagnosis not present

## 2020-08-09 DIAGNOSIS — H6123 Impacted cerumen, bilateral: Secondary | ICD-10-CM | POA: Diagnosis not present

## 2020-08-10 ENCOUNTER — Ambulatory Visit (INDEPENDENT_AMBULATORY_CARE_PROVIDER_SITE_OTHER): Payer: BC Managed Care – PPO | Admitting: Nurse Practitioner

## 2020-08-10 ENCOUNTER — Encounter: Payer: BC Managed Care – PPO | Admitting: Nurse Practitioner

## 2020-08-10 ENCOUNTER — Encounter: Payer: Self-pay | Admitting: Nurse Practitioner

## 2020-08-10 ENCOUNTER — Other Ambulatory Visit: Payer: Self-pay

## 2020-08-10 VITALS — BP 114/80 | HR 65 | Temp 98.3°F | Ht 66.2 in | Wt 165.8 lb

## 2020-08-10 DIAGNOSIS — E786 Lipoprotein deficiency: Secondary | ICD-10-CM | POA: Diagnosis not present

## 2020-08-10 DIAGNOSIS — S8991XD Unspecified injury of right lower leg, subsequent encounter: Secondary | ICD-10-CM | POA: Diagnosis not present

## 2020-08-10 DIAGNOSIS — Z0000 Encounter for general adult medical examination without abnormal findings: Secondary | ICD-10-CM | POA: Diagnosis not present

## 2020-08-10 LAB — CBC
Hematocrit: 39.2 % (ref 34.0–46.6)
Hemoglobin: 13 g/dL (ref 11.1–15.9)
MCH: 30.4 pg (ref 26.6–33.0)
MCHC: 33.2 g/dL (ref 31.5–35.7)
MCV: 92 fL (ref 79–97)
Platelets: 213 10*3/uL (ref 150–450)
RBC: 4.28 x10E6/uL (ref 3.77–5.28)
RDW: 12.6 % (ref 11.7–15.4)
WBC: 8.9 10*3/uL (ref 3.4–10.8)

## 2020-08-10 NOTE — Progress Notes (Signed)
I,Yamilka Roman [employer] as a Neurosurgeon for [employer], FNP.,have documented all relevant documentation on the behalf of Arnette Felts, FNP,as directed by  Arnette Felts, FNP while in the presence of Arnette Felts, FNP. This visit occurred during the SARS-CoV-2 public health emergency.  Safety protocols were in place, including screening questions prior to the visit, additional usage of staff PPE, and extensive cleaning of exam room while observing appropriate contact time as indicated for disinfecting solutions.  Subjective:     Patient ID: Connie Snow , female    DOB: Jan 03, 1986 , 35 y.o.   MRN: [id number]   Chief Complaint  Patient presents with  . Annual Exam    HPI  Patient here for hm. She will occasionally have a cycle early. Her GYN is Dr. Vickey Sages and routinely has her PAP yearly  Wt Readings from Last 3 Encounters: 08/10/20 : 165 lb 12.8 oz (75.2 kg) 06/01/19 : 165 lb 9.6 oz (75.1 kg) 05/26/18 : 161 lb (73 kg)  She was seen by Dr.Outlaw GI was normal for pain to right lateral abdomen, she had labs and imaging done and had normal results    Past Medical History:  Diagnosis Date  . HSV-1 (herpes simplex virus 1) infection      Family History  Problem Relation Age of Onset  . Hypertension Mother   . Diabetes Father      Current Outpatient Medications:  .  desonide (DESOWEN) 0.05 % cream, Apply topically 2 (two) times daily., Disp: 30 g, Rfl: 3 .  valACYclovir (VALTREX) 1000 MG tablet, Take 500 mg by mouth daily. As needed, Disp: , Rfl:    No Known Allergies    The patient states she uses none for birth control.  Patient's last menstrual period was 07/30/2020.. Negative for Dysmenorrhea and Negative for Menorrhagia. Negative for: breast discharge, breast lump(s), breast pain and breast self exam. Associated symptoms include abnormal vaginal bleeding. Pertinent negatives include abnormal bleeding (hematology), anxiety, decreased libido, depression, difficulty  falling sleep, dyspareunia, history of infertility, nocturia, sexual dysfunction, sleep disturbances, urinary incontinence, urinary urgency, vaginal discharge and vaginal itching. Diet: pescatarian. The patient states her exercise level is vigorous - injured her right knee partially tore her quad while playing volleyball. She is followed by Dr. Althea Charon orthopedics.  The patient's tobacco use is:  Social History   Tobacco Use  Smoking Status Never Smoker  Smokeless Tobacco Never Used   She has been exposed to passive smoke. The patient's alcohol use is:  Social History   Substance and Sexual Activity  Alcohol Use No   Additional information: Last pap 08/27/2019, next one scheduled for 08/26/2020  Review of Systems  Constitutional: Negative.   HENT: Negative.   Eyes: Negative.   Respiratory: Negative.   Cardiovascular: Negative.   Gastrointestinal: Negative.   Endocrine: Negative.   Genitourinary: Negative.   Musculoskeletal: Negative.        Right knee pain after having an injury torn muscle - being followed by Dr. Althea Charon  Skin: Negative.   Allergic/Immunologic: Negative.   Neurological: Negative.   Hematological: Negative.   Psychiatric/Behavioral: Negative.      Today's Vitals   08/10/20 1204  BP: 114/80  Pulse: 65  Temp: 98.3 F (36.8 C)  TempSrc: Oral  Weight: 165 lb 12.8 oz (75.2 kg)  Height: 5' 6.2" (1.681 m)  PainSc: 0-No pain   Body mass index is 26.6 kg/m.   Objective:  Physical Exam Vitals reviewed.  Constitutional:      General:  She is not in acute distress.    Appearance: Normal appearance. She is well-developed.  HENT:     Head: Normocephalic and atraumatic.     Right Ear: Hearing, tympanic membrane, ear canal and external ear normal. There is no impacted cerumen.     Left Ear: Hearing, tympanic membrane, ear canal and external ear normal. There is no impacted cerumen.     Nose:     Comments: Deferred - masked    Mouth/Throat:     Comments:  Deferred - masked Eyes:     General: Lids are normal.     Extraocular Movements: Extraocular movements intact.     Conjunctiva/sclera: Conjunctivae normal.     Pupils: Pupils are equal, round, and reactive to light.     Funduscopic exam:    Right eye: No papilledema.        Left eye: No papilledema.  Neck:     Thyroid: No thyroid mass.     Vascular: No carotid bruit.  Cardiovascular:     Rate and Rhythm: Normal rate and regular rhythm.     Pulses: Normal pulses.     Heart sounds: Normal heart sounds. No murmur heard.   Pulmonary:     Effort: Pulmonary effort is normal.     Breath sounds: Normal breath sounds. No wheezing.  Abdominal:     General: Abdomen is flat. Bowel sounds are normal. There is no distension.     Palpations: Abdomen is soft.     Tenderness: There is no abdominal tenderness.  Musculoskeletal:        General: Tenderness (right knee, appears swollen) present. No swelling. Normal range of motion.     Cervical back: Full passive range of motion without pain, normal range of motion and neck supple.     Right lower leg: No edema.     Left lower leg: No edema.     Comments: Left achilles with tape due to possible achilles tendonitis.  Skin:    General: Skin is warm and dry.     Capillary Refill: Capillary refill takes less than 2 seconds.  Neurological:     General: No focal deficit present.     Mental Status: She is alert and oriented to person, place, and time.     Cranial Nerves: No cranial nerve deficit.     Sensory: No sensory deficit.  Psychiatric:        Mood and Affect: Mood normal.        Behavior: Behavior normal.        Thought Content: Thought content normal.        Judgment: Judgment normal.         Assessment And Plan:     1. Encounter for general adult medical examination w/o abnormal findings . Behavior modifications discussed and diet history reviewed.   . Pt will continue to exercise regularly and modify diet with low GI, plant based  foods and decrease intake of processed foods.  . Recommend intake of daily multivitamin, Vitamin D, and calcium.  . Recommend for preventive screenings, as well as recommend immunizations that include influenza, TDAP (up to date) - CBC - Lipid panel  2. Injury of right knee, subsequent encounter  Continue follow up with Dr. Althea Charon  She is to start PT soon     Patient was given opportunity to ask questions. Patient verbalized understanding of the plan and was able to repeat key elements of the plan. All questions were answered to their satisfaction.  Arnette Felts, FNP   I, Arnette Felts, FNP, have reviewed all documentation for this visit. The documentation on 08/10/20 for the exam, diagnosis, procedures, and orders are all accurate and complete.   THE PATIENT IS ENCOURAGED TO PRACTICE SOCIAL DISTANCING DUE TO THE COVID-19 PANDEMIC.

## 2020-08-10 NOTE — Patient Instructions (Signed)
Health Maintenance, Female Adopting a healthy lifestyle and getting preventive care are important in promoting health and wellness. Ask your health care provider about:  The right schedule for you to have regular tests and exams.  Things you can do on your own to prevent diseases and keep yourself healthy. What should I know about diet, weight, and exercise? Eat a healthy diet  Eat a diet that includes plenty of vegetables, fruits, low-fat dairy products, and lean protein.  Do not eat a lot of foods that are high in solid fats, added sugars, or sodium.   Maintain a healthy weight Body mass index (BMI) is used to identify weight problems. It estimates body fat based on height and weight. Your health care provider can help determine your BMI and help you achieve or maintain a healthy weight. Get regular exercise Get regular exercise. This is one of the most important things you can do for your health. Most adults should:  Exercise for at least 150 minutes each week. The exercise should increase your heart rate and make you sweat (moderate-intensity exercise).  Do strengthening exercises at least twice a week. This is in addition to the moderate-intensity exercise.  Spend less time sitting. Even light physical activity can be beneficial. Watch cholesterol and blood lipids Have your blood tested for lipids and cholesterol at 37 years of age, then have this test every 5 years. Have your cholesterol levels checked more often if:  Your lipid or cholesterol levels are high.  You are older than 37 years of age.  You are at high risk for heart disease. What should I know about cancer screening? Depending on your health history and family history, you may need to have cancer screening at various ages. This may include screening for:  Breast cancer.  Cervical cancer.  Colorectal cancer.  Skin cancer.  Lung cancer. What should I know about heart disease, diabetes, and high blood  pressure? Blood pressure and heart disease  High blood pressure causes heart disease and increases the risk of stroke. This is more likely to develop in people who have high blood pressure readings, are of African descent, or are overweight.  Have your blood pressure checked: ? Every 3-5 years if you are 18-39 years of age. ? Every year if you are 40 years old or older. Diabetes Have regular diabetes screenings. This checks your fasting blood sugar level. Have the screening done:  Once every three years after age 40 if you are at a normal weight and have a low risk for diabetes.  More often and at a younger age if you are overweight or have a high risk for diabetes. What should I know about preventing infection? Hepatitis B If you have a higher risk for hepatitis B, you should be screened for this virus. Talk with your health care provider to find out if you are at risk for hepatitis B infection. Hepatitis C Testing is recommended for:  Everyone born from 1945 through 1965.  Anyone with known risk factors for hepatitis C. Sexually transmitted infections (STIs)  Get screened for STIs, including gonorrhea and chlamydia, if: ? You are sexually active and are younger than 37 years of age. ? You are older than 37 years of age and your health care provider tells you that you are at risk for this type of infection. ? Your sexual activity has changed since you were last screened, and you are at increased risk for chlamydia or gonorrhea. Ask your health care provider   if you are at risk.  Ask your health care provider about whether you are at high risk for HIV. Your health care provider may recommend a prescription medicine to help prevent HIV infection. If you choose to take medicine to prevent HIV, you should first get tested for HIV. You should then be tested every 3 months for as long as you are taking the medicine. Pregnancy  If you are about to stop having your period (premenopausal) and  you may become pregnant, seek counseling before you get pregnant.  Take 400 to 800 micrograms (mcg) of folic acid every day if you become pregnant.  Ask for birth control (contraception) if you want to prevent pregnancy. Osteoporosis and menopause Osteoporosis is a disease in which the bones lose minerals and strength with aging. This can result in bone fractures. If you are 65 years old or older, or if you are at risk for osteoporosis and fractures, ask your health care provider if you should:  Be screened for bone loss.  Take a calcium or vitamin D supplement to lower your risk of fractures.  Be given hormone replacement therapy (HRT) to treat symptoms of menopause. Follow these instructions at home: Lifestyle  Do not use any products that contain nicotine or tobacco, such as cigarettes, e-cigarettes, and chewing tobacco. If you need help quitting, ask your health care provider.  Do not use street drugs.  Do not share needles.  Ask your health care provider for help if you need support or information about quitting drugs. Alcohol use  Do not drink alcohol if: ? Your health care provider tells you not to drink. ? You are pregnant, may be pregnant, or are planning to become pregnant.  If you drink alcohol: ? Limit how much you use to 0-1 drink a day. ? Limit intake if you are breastfeeding.  Be aware of how much alcohol is in your drink. In the [place] one drink equals one 12 oz bottle of beer (355 mL), one 5 oz glass of wine (148 mL), or one 1 oz glass of hard liquor (44 mL). General instructions  Schedule regular health, dental, and eye exams.  Stay current with your vaccines.  Tell your health care provider if: ? You often feel depressed. ? You have ever been abused or do not feel safe at home. Summary  Adopting a healthy lifestyle and getting preventive care are important in promoting health and wellness.  Follow your health care provider's instructions about healthy  diet, exercising, and getting tested or screened for diseases.  Follow your health care provider's instructions on monitoring your cholesterol and blood pressure. This information is not intended to replace advice given to you by your health care provider. Make sure you discuss any questions you have with your health care provider. Document Revised: 03/26/2018 Document Reviewed: 03/26/2018 Elsevier Patient Education  2021 Elsevier Inc.  

## 2020-08-11 LAB — LIPID PANEL
Chol/HDL Ratio: 1.5 ratio (ref 0.0–4.4)
Cholesterol, Total: 99 mg/dL — ABNORMAL LOW (ref 100–199)
HDL: 64 mg/dL (ref >39)
LDL Chol Calc (NIH): 24 mg/dL (ref 0–99)
Triglycerides: 42 mg/dL (ref 0–149)
VLDL Cholesterol Cal: 11 mg/dL (ref 5–40)

## 2020-08-15 DIAGNOSIS — K6289 Other specified diseases of anus and rectum: Secondary | ICD-10-CM | POA: Diagnosis not present

## 2020-08-17 DIAGNOSIS — S76111S Strain of right quadriceps muscle, fascia and tendon, sequela: Secondary | ICD-10-CM | POA: Diagnosis not present

## 2020-08-17 DIAGNOSIS — R531 Weakness: Secondary | ICD-10-CM | POA: Diagnosis not present

## 2020-08-22 DIAGNOSIS — R531 Weakness: Secondary | ICD-10-CM | POA: Diagnosis not present

## 2020-08-22 DIAGNOSIS — S76111S Strain of right quadriceps muscle, fascia and tendon, sequela: Secondary | ICD-10-CM | POA: Diagnosis not present

## 2020-08-23 DIAGNOSIS — F4320 Adjustment disorder, unspecified: Secondary | ICD-10-CM | POA: Diagnosis not present

## 2020-08-30 DIAGNOSIS — R531 Weakness: Secondary | ICD-10-CM | POA: Diagnosis not present

## 2020-08-30 DIAGNOSIS — S76111S Strain of right quadriceps muscle, fascia and tendon, sequela: Secondary | ICD-10-CM | POA: Diagnosis not present

## 2020-09-01 DIAGNOSIS — Z01419 Encounter for gynecological examination (general) (routine) without abnormal findings: Secondary | ICD-10-CM | POA: Diagnosis not present

## 2020-09-01 DIAGNOSIS — R531 Weakness: Secondary | ICD-10-CM | POA: Diagnosis not present

## 2020-09-01 DIAGNOSIS — Z6825 Body mass index (BMI) 25.0-25.9, adult: Secondary | ICD-10-CM | POA: Diagnosis not present

## 2020-09-01 DIAGNOSIS — S76111S Strain of right quadriceps muscle, fascia and tendon, sequela: Secondary | ICD-10-CM | POA: Diagnosis not present

## 2020-09-06 DIAGNOSIS — F4320 Adjustment disorder, unspecified: Secondary | ICD-10-CM | POA: Diagnosis not present

## 2020-09-06 DIAGNOSIS — S76111S Strain of right quadriceps muscle, fascia and tendon, sequela: Secondary | ICD-10-CM | POA: Diagnosis not present

## 2020-09-06 DIAGNOSIS — R531 Weakness: Secondary | ICD-10-CM | POA: Diagnosis not present

## 2020-09-14 DIAGNOSIS — S76111S Strain of right quadriceps muscle, fascia and tendon, sequela: Secondary | ICD-10-CM | POA: Diagnosis not present

## 2020-09-14 DIAGNOSIS — R531 Weakness: Secondary | ICD-10-CM | POA: Diagnosis not present

## 2020-09-16 DIAGNOSIS — R531 Weakness: Secondary | ICD-10-CM | POA: Diagnosis not present

## 2020-09-16 DIAGNOSIS — S76111S Strain of right quadriceps muscle, fascia and tendon, sequela: Secondary | ICD-10-CM | POA: Diagnosis not present

## 2020-09-19 DIAGNOSIS — S76111S Strain of right quadriceps muscle, fascia and tendon, sequela: Secondary | ICD-10-CM | POA: Diagnosis not present

## 2020-09-19 DIAGNOSIS — R531 Weakness: Secondary | ICD-10-CM | POA: Diagnosis not present

## 2020-09-20 DIAGNOSIS — F4320 Adjustment disorder, unspecified: Secondary | ICD-10-CM | POA: Diagnosis not present

## 2020-09-28 DIAGNOSIS — R531 Weakness: Secondary | ICD-10-CM | POA: Diagnosis not present

## 2020-09-28 DIAGNOSIS — S76111S Strain of right quadriceps muscle, fascia and tendon, sequela: Secondary | ICD-10-CM | POA: Diagnosis not present

## 2020-09-30 DIAGNOSIS — R531 Weakness: Secondary | ICD-10-CM | POA: Diagnosis not present

## 2020-09-30 DIAGNOSIS — S76111S Strain of right quadriceps muscle, fascia and tendon, sequela: Secondary | ICD-10-CM | POA: Diagnosis not present

## 2020-10-03 DIAGNOSIS — S76111S Strain of right quadriceps muscle, fascia and tendon, sequela: Secondary | ICD-10-CM | POA: Diagnosis not present

## 2020-10-03 DIAGNOSIS — R531 Weakness: Secondary | ICD-10-CM | POA: Diagnosis not present

## 2020-10-05 DIAGNOSIS — R531 Weakness: Secondary | ICD-10-CM | POA: Diagnosis not present

## 2020-10-05 DIAGNOSIS — S76111S Strain of right quadriceps muscle, fascia and tendon, sequela: Secondary | ICD-10-CM | POA: Diagnosis not present

## 2020-10-10 DIAGNOSIS — M25561 Pain in right knee: Secondary | ICD-10-CM | POA: Diagnosis not present

## 2020-10-25 DIAGNOSIS — F4320 Adjustment disorder, unspecified: Secondary | ICD-10-CM | POA: Diagnosis not present

## 2020-10-27 DIAGNOSIS — R531 Weakness: Secondary | ICD-10-CM | POA: Diagnosis not present

## 2020-10-27 DIAGNOSIS — S76111S Strain of right quadriceps muscle, fascia and tendon, sequela: Secondary | ICD-10-CM | POA: Diagnosis not present

## 2020-11-08 DIAGNOSIS — F4320 Adjustment disorder, unspecified: Secondary | ICD-10-CM | POA: Diagnosis not present

## 2020-11-09 DIAGNOSIS — R531 Weakness: Secondary | ICD-10-CM | POA: Diagnosis not present

## 2020-11-09 DIAGNOSIS — S76111S Strain of right quadriceps muscle, fascia and tendon, sequela: Secondary | ICD-10-CM | POA: Diagnosis not present

## 2020-11-22 DIAGNOSIS — F4320 Adjustment disorder, unspecified: Secondary | ICD-10-CM | POA: Diagnosis not present

## 2020-11-24 ENCOUNTER — Ambulatory Visit: Payer: BC Managed Care – PPO | Admitting: Nurse Practitioner

## 2020-11-25 ENCOUNTER — Encounter: Payer: Self-pay | Admitting: Nurse Practitioner

## 2020-11-29 DIAGNOSIS — S76111S Strain of right quadriceps muscle, fascia and tendon, sequela: Secondary | ICD-10-CM | POA: Diagnosis not present

## 2020-11-29 DIAGNOSIS — R531 Weakness: Secondary | ICD-10-CM | POA: Diagnosis not present

## 2020-12-05 ENCOUNTER — Telehealth: Payer: Self-pay

## 2020-12-05 NOTE — Telephone Encounter (Signed)
I returned the pt's call and left a message that the physical forms aren't complete yet, the pt will get called once they are complete.

## 2020-12-06 ENCOUNTER — Other Ambulatory Visit: Payer: Self-pay

## 2020-12-06 ENCOUNTER — Ambulatory Visit: Payer: BC Managed Care – PPO

## 2020-12-06 VITALS — BP 110/64 | HR 74 | Temp 98.0°F | Ht 66.4 in | Wt 162.6 lb

## 2020-12-06 DIAGNOSIS — F4320 Adjustment disorder, unspecified: Secondary | ICD-10-CM | POA: Diagnosis not present

## 2020-12-06 DIAGNOSIS — Z6825 Body mass index (BMI) 25.0-25.9, adult: Secondary | ICD-10-CM

## 2020-12-06 NOTE — Progress Notes (Signed)
The patient is here today for vital signs that are needed to complete her health physician form by the provider. Patient's waist was measured at 31.

## 2020-12-20 DIAGNOSIS — F4320 Adjustment disorder, unspecified: Secondary | ICD-10-CM | POA: Diagnosis not present

## 2021-01-03 DIAGNOSIS — F4320 Adjustment disorder, unspecified: Secondary | ICD-10-CM | POA: Diagnosis not present

## 2021-01-04 DIAGNOSIS — H6123 Impacted cerumen, bilateral: Secondary | ICD-10-CM | POA: Diagnosis not present

## 2021-01-17 DIAGNOSIS — F4320 Adjustment disorder, unspecified: Secondary | ICD-10-CM | POA: Diagnosis not present

## 2021-01-31 DIAGNOSIS — F330 Major depressive disorder, recurrent, mild: Secondary | ICD-10-CM | POA: Diagnosis not present

## 2021-02-14 DIAGNOSIS — F330 Major depressive disorder, recurrent, mild: Secondary | ICD-10-CM | POA: Diagnosis not present

## 2021-02-20 ENCOUNTER — Encounter: Payer: Self-pay | Admitting: Nurse Practitioner

## 2021-03-15 DIAGNOSIS — F330 Major depressive disorder, recurrent, mild: Secondary | ICD-10-CM | POA: Diagnosis not present

## 2021-03-17 ENCOUNTER — Telehealth: Payer: Self-pay

## 2021-03-17 NOTE — Telephone Encounter (Signed)
The pt's sister Neu was notified that her physician form is ready for pickup to participate in the [employer].

## 2021-03-30 DIAGNOSIS — F330 Major depressive disorder, recurrent, mild: Secondary | ICD-10-CM | POA: Diagnosis not present

## 2021-04-25 DIAGNOSIS — M7711 Lateral epicondylitis, right elbow: Secondary | ICD-10-CM | POA: Diagnosis not present

## 2021-06-06 DIAGNOSIS — F330 Major depressive disorder, recurrent, mild: Secondary | ICD-10-CM | POA: Diagnosis not present

## 2021-06-26 DIAGNOSIS — F330 Major depressive disorder, recurrent, mild: Secondary | ICD-10-CM | POA: Diagnosis not present

## 2021-07-05 DIAGNOSIS — H6123 Impacted cerumen, bilateral: Secondary | ICD-10-CM | POA: Diagnosis not present

## 2021-07-10 DIAGNOSIS — F330 Major depressive disorder, recurrent, mild: Secondary | ICD-10-CM | POA: Diagnosis not present

## 2021-08-03 DIAGNOSIS — F330 Major depressive disorder, recurrent, mild: Secondary | ICD-10-CM | POA: Diagnosis not present

## 2021-08-14 ENCOUNTER — Ambulatory Visit (INDEPENDENT_AMBULATORY_CARE_PROVIDER_SITE_OTHER): Payer: BC Managed Care – PPO | Admitting: Nurse Practitioner

## 2021-08-14 ENCOUNTER — Encounter: Payer: Self-pay | Admitting: Nurse Practitioner

## 2021-08-14 VITALS — BP 126/70 | HR 70 | Temp 97.9°F | Ht 66.4 in | Wt 167.0 lb

## 2021-08-14 DIAGNOSIS — Z0000 Encounter for general adult medical examination without abnormal findings: Secondary | ICD-10-CM

## 2021-08-14 DIAGNOSIS — Z6826 Body mass index (BMI) 26.0-26.9, adult: Secondary | ICD-10-CM

## 2021-08-14 NOTE — Progress Notes (Signed)
?Clinical biochemist as a Neurosurgeon for [employer], FNP.,have documented all relevant documentation on the behalf of Arnette Felts, FNP,as directed by  Arnette Felts, FNP while in the presence of Arnette Felts, FNP. ? ?This visit occurred during the SARS-CoV-2 public health emergency.  Safety protocols were in place, including screening questions prior to the visit, additional usage of staff PPE, and extensive cleaning of exam room while observing appropriate contact time as indicated for disinfecting solutions. ? ?Subjective:  ?  ? Patient ID: Connie Snow , female    DOB: 1985/08/18 , 36 y.o.   MRN: [id number] ? ? ?Chief Complaint  ?Patient presents with  ? Annual Exam  ? ? ?HPI ? ?Patient here for hm. She will occasionally have a cycle early. Her GYN is Dr. Vickey Sages and routinely has her PAP yearly. 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 She is currently on her menstrual cycle.  ? ?Wt Readings from Last 3 Encounters: ?08/14/21 : 167 lb (75.8 kg) ?12/06/20 : 162 lb 9.6 oz (73.8 kg) ?08/10/20 : 165 lb 12.8 oz (75.2 kg) ? ? ?  ? ?Past Medical History:  ?Diagnosis Date  ? HSV-1 (herpes simplex virus 1) infection   ?  ? ?Family History  ?Problem Relation Age of Onset  ? Hypertension Mother   ? Diabetes Father   ? ? ? ?Current Outpatient Medications:  ?  valACYclovir (VALTREX) 1000 MG tablet, Take 500 mg by mouth daily. As needed, Disp: , Rfl:   ? ?No Known Allergies  ? ? ?The patient states she uses none for birth control. Patient's last menstrual period was 08/10/2021.. Negative for Dysmenorrhea and Negative for Menorrhagia. Negative for: breast discharge, breast lump(s), breast pain and breast self exam. Associated symptoms include abnormal vaginal bleeding. Pertinent negatives include abnormal bleeding (hematology), anxiety, decreased libido, depression, difficulty falling sleep, dyspareunia, history of infertility, nocturia, sexual dysfunction, sleep disturbances,  urinary incontinence, urinary urgency, vaginal discharge and vaginal itching. Diet regular.The patient states her exercise level is vigorous.  ? ?The patient's tobacco use is:  ?Social History  ? ?Tobacco Use  ?Smoking Status Never  ?Smokeless Tobacco Never  ? ?She has been exposed to passive smoke. The patient's alcohol use is:  ?Social History  ? ?Substance and Sexual Activity  ?Alcohol Use No  ? ?Additional information: Last pap 08/27/2019 documented in chart, she reports she had one last year and next one scheduled for 08/26/2021.   ? ?Review of Systems  ?Constitutional: Negative.   ?HENT: Negative.    ?Eyes: Negative.   ?Respiratory: Negative.    ?Cardiovascular: Negative.   ?Gastrointestinal: Negative.   ?Endocrine: Negative.   ?Genitourinary: Negative.   ?Musculoskeletal: Negative.   ?Skin: Negative.   ?Allergic/Immunologic: Negative.   ?Neurological: Negative.   ?Hematological: Negative.   ?Psychiatric/Behavioral: Negative.     ? ?Today's Vitals  ? 08/14/21 1111  ?BP: 126/70  ?Pulse: 70  ?Temp: 97.9 ?F (36.6 ?C)  ?TempSrc: Oral  ?Weight: 167 lb (75.8 kg)  ?Height: 5' 6.4" (1.687 m)  ? ?Body mass index is 26.63 kg/m?.  ? ?Objective:  ?Physical Exam ?Vitals reviewed.  ?Constitutional:   ?   General: She is not in acute distress. ?   Appearance: Normal appearance. She is well-developed.  ?HENT:  ?   Head: Normocephalic and atraumatic.  ?   Right Ear: Hearing, tympanic membrane, ear canal and external ear normal. There is no impacted cerumen.  ?   Left Ear: Hearing, tympanic membrane,  ear canal and external ear normal. There is no impacted cerumen.  ?   Nose: Nose normal.  ?   Mouth/Throat:  ?   Mouth: Mucous membranes are moist.  ?Eyes:  ?   General: Lids are normal.  ?   Extraocular Movements: Extraocular movements intact.  ?   Conjunctiva/sclera: Conjunctivae normal.  ?   Pupils: Pupils are equal, round, and reactive to light.  ?   Funduscopic exam: ?   Right eye: No papilledema.     ?   Left eye: No  papilledema.  ?Neck:  ?   Thyroid: No thyroid mass.  ?   Vascular: No carotid bruit.  ?Cardiovascular:  ?   Rate and Rhythm: Normal rate and regular rhythm.  ?   Pulses: Normal pulses.  ?   Heart sounds: Normal heart sounds. No murmur heard. ?Pulmonary:  ?   Effort: Pulmonary effort is normal.  ?   Breath sounds: Normal breath sounds. No wheezing.  ?Abdominal:  ?   General: Abdomen is flat. Bowel sounds are normal. There is no distension.  ?   Palpations: Abdomen is soft.  ?   Tenderness: There is no abdominal tenderness.  ?Musculoskeletal:     ?   General: No swelling or tenderness. Normal range of motion.  ?   Cervical back: Full passive range of motion without pain, normal range of motion and neck supple.  ?   Right lower leg: No edema.  ?   Left lower leg: No edema.  ?Skin: ?   General: Skin is warm and dry.  ?   Capillary Refill: Capillary refill takes less than 2 seconds.  ?Neurological:  ?   General: No focal deficit present.  ?   Mental Status: She is alert and oriented to person, place, and time.  ?   Cranial Nerves: No cranial nerve deficit.  ?   Sensory: No sensory deficit.  ?Psychiatric:     ?   Mood and Affect: Mood normal.     ?   Behavior: Behavior normal.     ?   Thought Content: Thought content normal.     ?   Judgment: Judgment normal.  ?  ? ?   ?Assessment And Plan:  ?   ?1. Encounter for general adult medical examination w/o abnormal findings ?Behavior modifications discussed and diet history reviewed.   ?Pt will continue to exercise regularly and modify diet with low GI, plant based foods and decrease intake of processed foods.  ?Recommend intake of daily multivitamin, Vitamin D, and calcium.  ?Recommend for preventive screenings, as well as recommend immunizations that include influenza, TDAP (up to date) ?- VITAMIN D 25 Hydroxy (Vit-D Deficiency, Fractures) ?- CBC ?- Lipid panel ? ?2. BMI 26.0-26.9,adult ?Comments: She has a fit stature.  ? ? ? ?Patient was given opportunity to ask questions.  Patient verbalized understanding of the plan and was able to repeat key elements of the plan. All questions were answered to their satisfaction.  ? ?Arnette Felts, FNP  ? ?I, Arnette Felts, FNP, have reviewed all documentation for this visit. The documentation on 08/14/21 for the exam, diagnosis, procedures, and orders are all accurate and complete.  ? ?THE PATIENT IS ENCOURAGED TO PRACTICE SOCIAL DISTANCING DUE TO THE COVID-19 PANDEMIC.   ?

## 2021-08-14 NOTE — Patient Instructions (Signed)

## 2021-08-15 LAB — CBC
Hematocrit: 35.6 % (ref 34.0–46.6)
Hemoglobin: 12.3 g/dL (ref 11.1–15.9)
MCH: 31.7 pg (ref 26.6–33.0)
MCHC: 34.6 g/dL (ref 31.5–35.7)
MCV: 92 fL (ref 79–97)
Platelets: 224 10*3/uL (ref 150–450)
RBC: 3.88 x10E6/uL (ref 3.77–5.28)
RDW: 12.5 % (ref 11.7–15.4)
WBC: 7.8 10*3/uL (ref 3.4–10.8)

## 2021-08-15 LAB — LIPID PANEL
Chol/HDL Ratio: 1.5 ratio (ref 0.0–4.4)
Cholesterol, Total: 93 mg/dL — ABNORMAL LOW (ref 100–199)
HDL: 61 mg/dL (ref >39)
LDL Chol Calc (NIH): 21 mg/dL (ref 0–99)
Triglycerides: 34 mg/dL (ref 0–149)
VLDL Cholesterol Cal: 11 mg/dL (ref 5–40)

## 2021-08-15 LAB — VITAMIN D 25 HYDROXY (VIT D DEFICIENCY, FRACTURES): Vit D, 25-Hydroxy: 30.1 ng/mL (ref 30.0–100.0)

## 2021-08-21 DIAGNOSIS — F330 Major depressive disorder, recurrent, mild: Secondary | ICD-10-CM | POA: Diagnosis not present

## 2021-09-18 DIAGNOSIS — F330 Major depressive disorder, recurrent, mild: Secondary | ICD-10-CM | POA: Diagnosis not present

## 2021-10-09 DIAGNOSIS — F330 Major depressive disorder, recurrent, mild: Secondary | ICD-10-CM | POA: Diagnosis not present

## 2021-10-23 DIAGNOSIS — F330 Major depressive disorder, recurrent, mild: Secondary | ICD-10-CM | POA: Diagnosis not present

## 2021-11-06 DIAGNOSIS — F330 Major depressive disorder, recurrent, mild: Secondary | ICD-10-CM | POA: Diagnosis not present

## 2021-11-21 DIAGNOSIS — F330 Major depressive disorder, recurrent, mild: Secondary | ICD-10-CM | POA: Diagnosis not present

## 2021-11-25 IMAGING — CT CT ABD-PELV W/ CM
2 of 4 series · 17 of 46 positions shown, 19 images · IV contrast (iopamidol)
Comparison: None.

CLINICAL DATA: Abdominal and rectal pain for 6 months.

EXAM:
CT ABDOMEN AND PELVIS WITH CONTRAST
TECHNIQUE: Multidetector CT imaging of the abdomen and pelvis was performed
using the standard protocol following bolus administration of
intravenous contrast.
CONTRAST:  100mL [id number] IOPAMIDOL (JC12XF-NZZ) INJECTION 61%

[Series 2: abd pelvis 5.00 br40 s3 axial · axial · 0.73mm/px · z∈[+1204,+1579]mm · 14 of 85 slices shown, 16 images]
[im 5/85  soft-tissue]
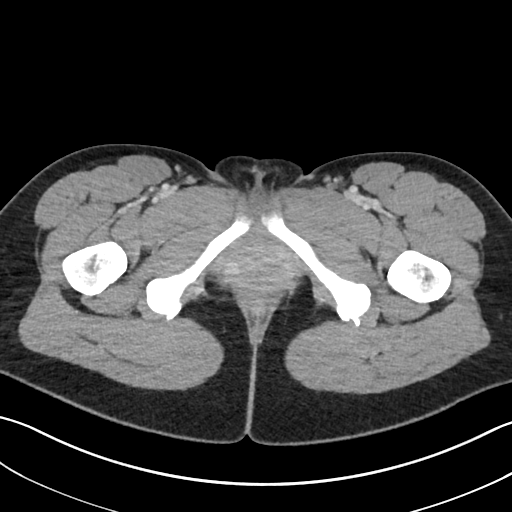
[im 5/85  bone]
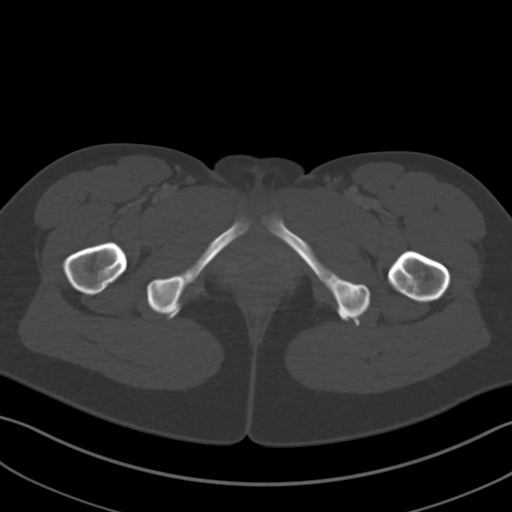
[im 13/85  soft-tissue]
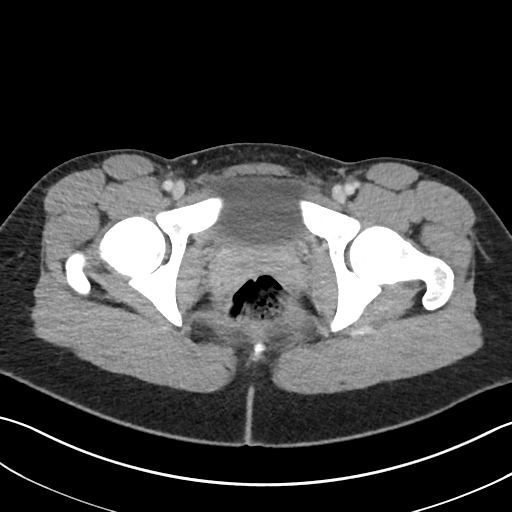
[im 17/85  soft-tissue]
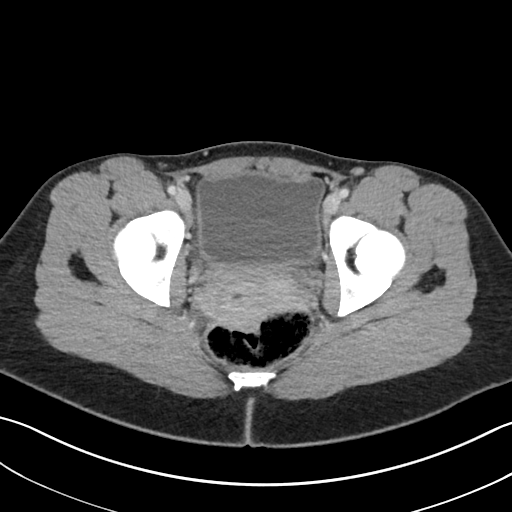
[im 22/85  soft-tissue]
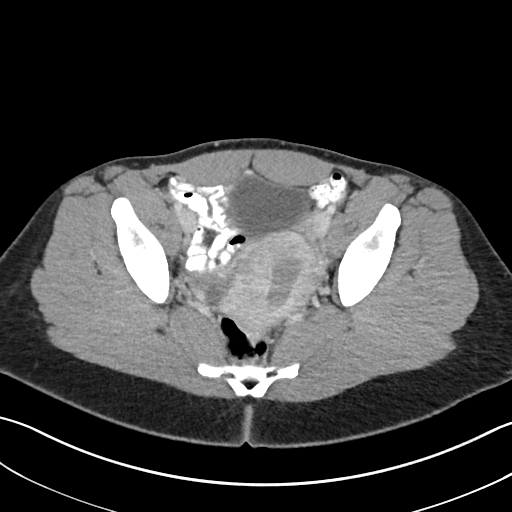
[im 30/85  soft-tissue]
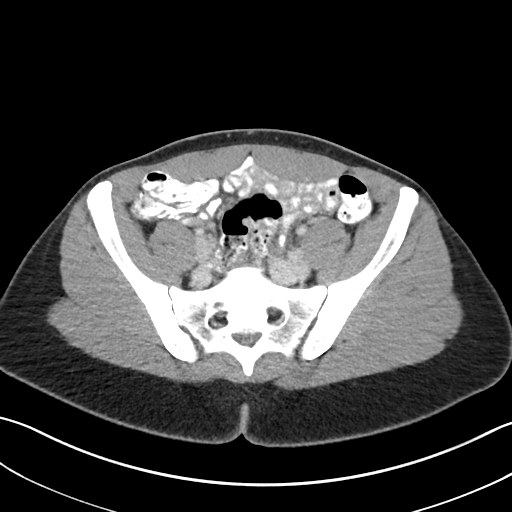
[im 34/85  soft-tissue]
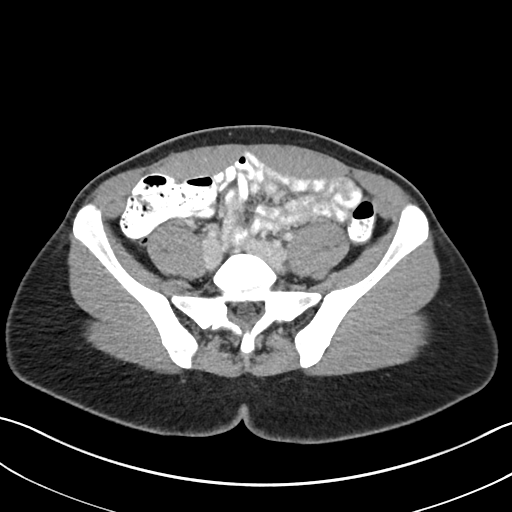
[im 38/85  soft-tissue]
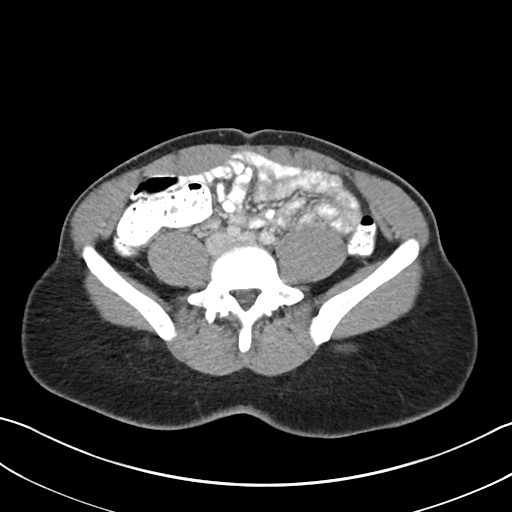
[im 47/85  soft-tissue]
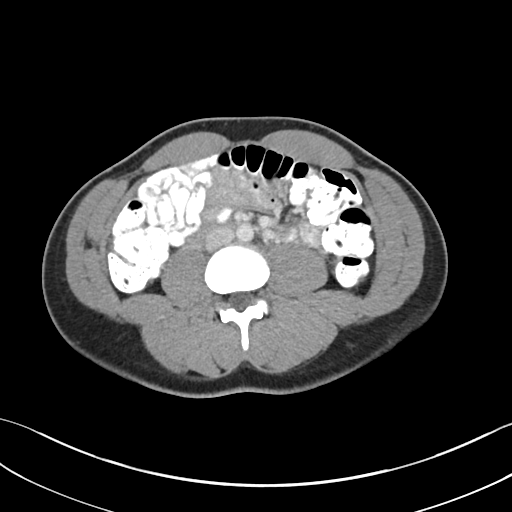
[im 51/85  soft-tissue]
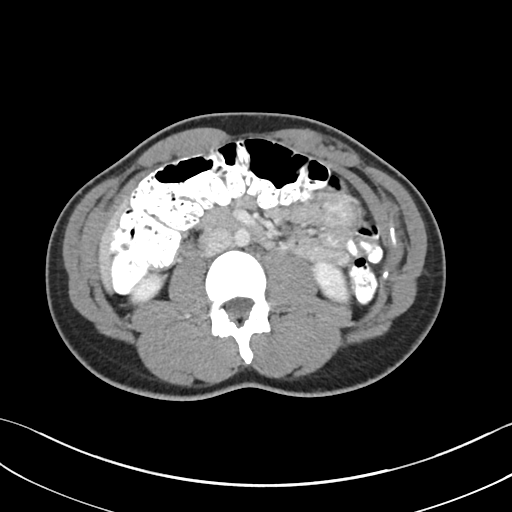
[im 51/85  bone]
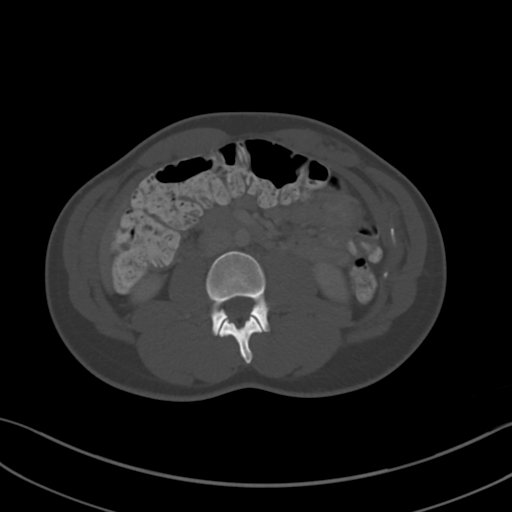
[im 55/85  soft-tissue]
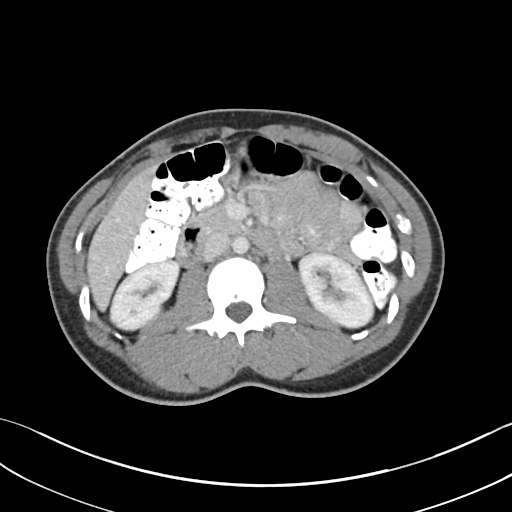
[im 64/85  soft-tissue]
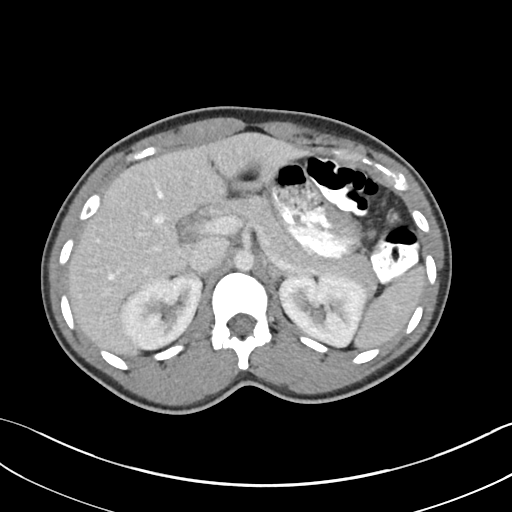
[im 68/85  soft-tissue]
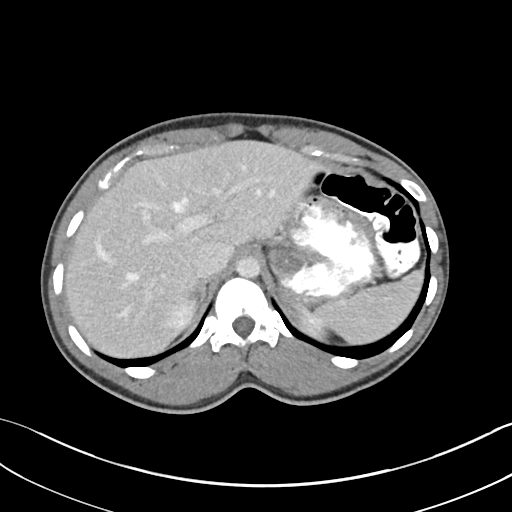
[im 72/85  soft-tissue]
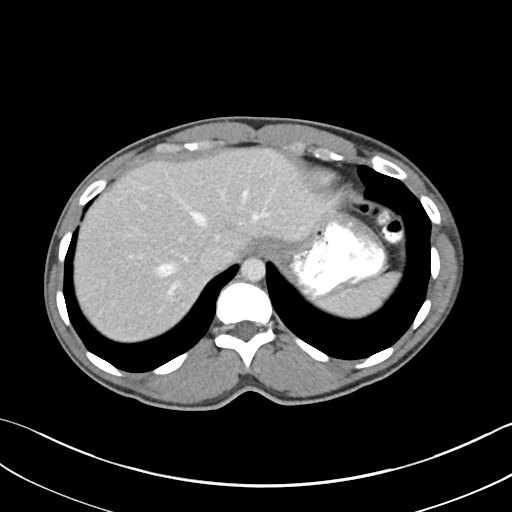
[im 80/85  soft-tissue]
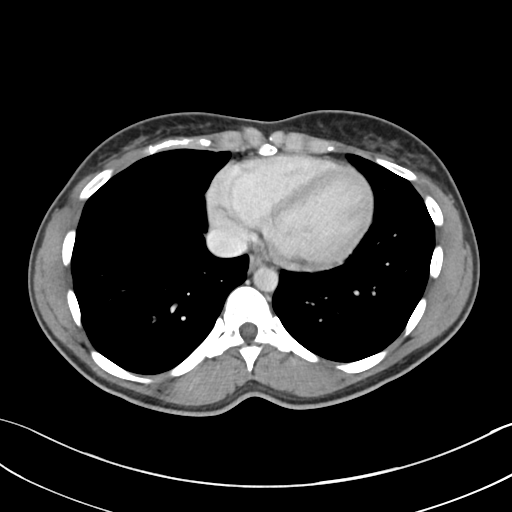

[Series 6: abd pelvis 2.00 br40 s3 cor · coronal · 0.73mm/px · 3 of 161 slices shown]
[im 54/161  soft-tissue]
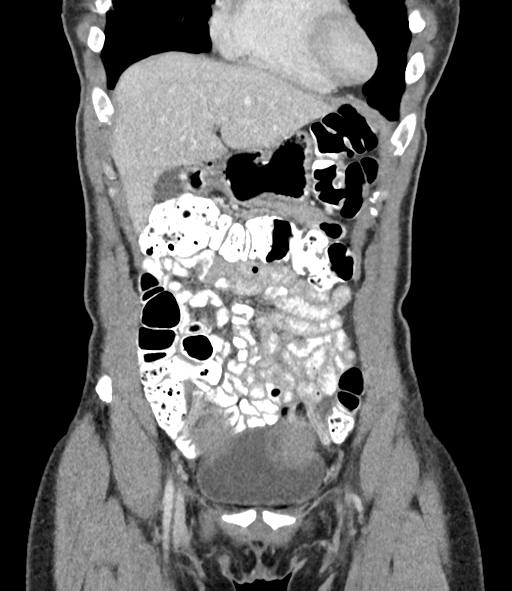
[im 72/161  soft-tissue]
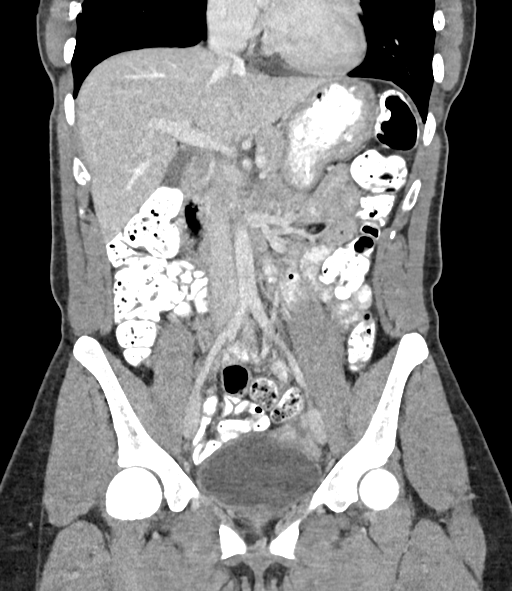
[im 89/161  soft-tissue]
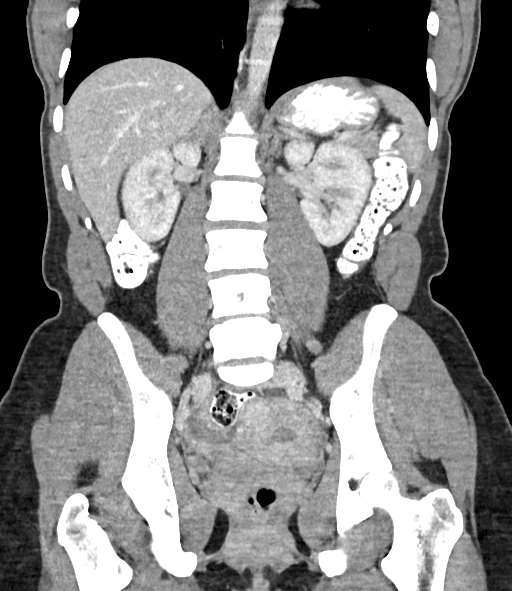

[17 of 46 positions shown; findings below may reference images not displayed]

FINDINGS: Lower Chest: No acute findings.

Hepatobiliary: No hepatic masses identified. Gallbladder is
unremarkable. No evidence of biliary ductal dilatation.

Pancreas:  No mass or inflammatory changes.

Spleen: Within normal limits in size and appearance.

Adrenals/Urinary Tract: No masses identified. No evidence of
ureteral calculi or hydronephrosis.

Stomach/Bowel: No evidence of obstruction, inflammatory process or
abnormal fluid collections. Normal appendix visualized. No evidence
of perianal or perirectal inflammatory changes or abscess.

Vascular/Lymphatic: No pathologically enlarged lymph nodes. No
abdominal aortic aneurysm.

Reproductive: A fibroid is seen in the right posterior uterine
fundus measuring 3 cm. No other pelvic masses identified. No
evidence of inflammatory process or free fluid.

Other:  None.

Musculoskeletal:  No suspicious bone lesions identified.
IMPRESSION: No acute findings.

3 cm uterine fibroid.

## 2021-12-07 DIAGNOSIS — F330 Major depressive disorder, recurrent, mild: Secondary | ICD-10-CM | POA: Diagnosis not present

## 2021-12-11 DIAGNOSIS — Z113 Encounter for screening for infections with a predominantly sexual mode of transmission: Secondary | ICD-10-CM | POA: Diagnosis not present

## 2021-12-11 DIAGNOSIS — N926 Irregular menstruation, unspecified: Secondary | ICD-10-CM | POA: Diagnosis not present

## 2021-12-11 DIAGNOSIS — N898 Other specified noninflammatory disorders of vagina: Secondary | ICD-10-CM | POA: Diagnosis not present

## 2021-12-21 DIAGNOSIS — F330 Major depressive disorder, recurrent, mild: Secondary | ICD-10-CM | POA: Diagnosis not present

## 2021-12-26 ENCOUNTER — Encounter: Payer: Self-pay | Admitting: Nurse Practitioner

## 2021-12-29 DIAGNOSIS — N921 Excessive and frequent menstruation with irregular cycle: Secondary | ICD-10-CM | POA: Diagnosis not present

## 2021-12-29 DIAGNOSIS — N926 Irregular menstruation, unspecified: Secondary | ICD-10-CM | POA: Diagnosis not present

## 2022-01-02 DIAGNOSIS — H6123 Impacted cerumen, bilateral: Secondary | ICD-10-CM | POA: Diagnosis not present

## 2022-01-15 DIAGNOSIS — Z01419 Encounter for gynecological examination (general) (routine) without abnormal findings: Secondary | ICD-10-CM | POA: Diagnosis not present

## 2022-01-15 DIAGNOSIS — Z1151 Encounter for screening for human papillomavirus (HPV): Secondary | ICD-10-CM | POA: Diagnosis not present

## 2022-01-15 DIAGNOSIS — Z6826 Body mass index (BMI) 26.0-26.9, adult: Secondary | ICD-10-CM | POA: Diagnosis not present

## 2022-01-15 DIAGNOSIS — Z124 Encounter for screening for malignant neoplasm of cervix: Secondary | ICD-10-CM | POA: Diagnosis not present

## 2022-01-18 DIAGNOSIS — F330 Major depressive disorder, recurrent, mild: Secondary | ICD-10-CM | POA: Diagnosis not present

## 2022-02-07 DIAGNOSIS — F330 Major depressive disorder, recurrent, mild: Secondary | ICD-10-CM | POA: Diagnosis not present

## 2022-02-15 DIAGNOSIS — N83201 Unspecified ovarian cyst, right side: Secondary | ICD-10-CM | POA: Diagnosis not present

## 2022-02-28 DIAGNOSIS — F330 Major depressive disorder, recurrent, mild: Secondary | ICD-10-CM | POA: Diagnosis not present

## 2022-03-21 DIAGNOSIS — F330 Major depressive disorder, recurrent, mild: Secondary | ICD-10-CM | POA: Diagnosis not present

## 2022-05-03 DIAGNOSIS — F330 Major depressive disorder, recurrent, mild: Secondary | ICD-10-CM | POA: Diagnosis not present

## 2022-06-05 DIAGNOSIS — F330 Major depressive disorder, recurrent, mild: Secondary | ICD-10-CM | POA: Diagnosis not present

## 2022-07-03 DIAGNOSIS — H6123 Impacted cerumen, bilateral: Secondary | ICD-10-CM | POA: Diagnosis not present

## 2022-07-19 DIAGNOSIS — F330 Major depressive disorder, recurrent, mild: Secondary | ICD-10-CM | POA: Diagnosis not present

## 2022-08-16 ENCOUNTER — Encounter: Payer: Self-pay | Admitting: Nurse Practitioner

## 2022-08-16 ENCOUNTER — Ambulatory Visit (INDEPENDENT_AMBULATORY_CARE_PROVIDER_SITE_OTHER): Payer: BC Managed Care – PPO | Admitting: Nurse Practitioner

## 2022-08-16 VITALS — BP 100/60 | HR 85 | Temp 98.1°F | Ht 66.0 in | Wt 164.6 lb

## 2022-08-16 DIAGNOSIS — Z6826 Body mass index (BMI) 26.0-26.9, adult: Secondary | ICD-10-CM | POA: Diagnosis not present

## 2022-08-16 DIAGNOSIS — Z1321 Encounter for screening for nutritional disorder: Secondary | ICD-10-CM

## 2022-08-16 DIAGNOSIS — Z13228 Encounter for screening for other metabolic disorders: Secondary | ICD-10-CM | POA: Diagnosis not present

## 2022-08-16 DIAGNOSIS — Z0000 Encounter for general adult medical examination without abnormal findings: Secondary | ICD-10-CM | POA: Diagnosis not present

## 2022-08-16 NOTE — Patient Instructions (Signed)

## 2022-08-16 NOTE — Progress Notes (Signed)
Connie Snow,acting as a Neurosurgeon for Connie Felts, FNP.,have documented all relevant documentation on the behalf of Connie Felts, FNP,as directed by  Connie Felts, FNP while in the presence of Connie Felts, FNP.    Subjective:     Patient ID: Connie Snow , female    DOB: 09-Mar-1986 , 37 y.o.   MRN: [id number]   Chief Complaint  Patient presents with   Annual Exam    HPI  Patient presents today for HM, patient states compliance with medications and has no other concerns today.  She seen her OB since her last visit. She has been having abnormal bleeding but everything was.  Exercising - 6 days a week Diet - pescatarian  Wt Readings from Last 3 Encounters: 08/16/22 : 164 lb 9.6 oz (74.7 kg) 08/14/21 : 167 lb (75.8 kg) 12/06/20 : 162 lb 9.6 oz (73.8 kg)    BP Readings from Last 3 Encounters: 08/16/22 : 100/60 08/14/21 : 126/70 12/06/20 : 110/64      Past Medical History:  Diagnosis Date   HSV-1 (herpes simplex virus 1) infection      Family History  Problem Relation Age of Onset   Hypertension Mother    Diabetes Father      Current Outpatient Medications:    valACYclovir (VALTREX) 1000 MG tablet, Take 500 mg by mouth daily. As needed, Disp: , Rfl:    No Known Allergies   Review of Systems  Constitutional: Negative.   HENT: Negative.    Eyes: Negative.   Respiratory: Negative.    Cardiovascular: Negative.   Gastrointestinal: Negative.   Endocrine: Negative.   Genitourinary: Negative.   Musculoskeletal: Negative.   Skin: Negative.   Allergic/Immunologic: Negative.   Neurological: Negative.   Hematological: Negative.   Psychiatric/Behavioral: Negative.       Today's Vitals   08/16/22 1005  BP: 100/60  Pulse: 85  Temp: 98.1 F (36.7 C)  TempSrc: Oral  Weight: 164 lb 9.6 oz (74.7 kg)  Height: 5\' 6"  (1.676 m)  PainSc: 0-No pain   Body mass index is 26.57 kg/m.  Wt Readings from Last 3 Encounters:  08/16/22 164 lb 9.6 oz (74.7 kg)   08/14/21 167 lb (75.8 kg)  12/06/20 162 lb 9.6 oz (73.8 kg)    Objective:  Physical Exam Vitals reviewed.  Constitutional:      General: She is not in acute distress.    Appearance: Normal appearance. She is well-developed.  HENT:     Head: Normocephalic and atraumatic.     Right Ear: Hearing, tympanic membrane, ear canal and external ear normal. There is no impacted cerumen.     Left Ear: Hearing, tympanic membrane, ear canal and external ear normal. There is no impacted cerumen.     Nose: Nose normal.     Mouth/Throat:     Mouth: Mucous membranes are moist.  Eyes:     General: Lids are normal.     Extraocular Movements: Extraocular movements intact.     Conjunctiva/sclera: Conjunctivae normal.     Pupils: Pupils are equal, round, and reactive to light.     Funduscopic exam:    Right eye: No papilledema.        Left eye: No papilledema.  Neck:     Thyroid: No thyroid mass.     Vascular: No carotid bruit.  Cardiovascular:     Rate and Rhythm: Normal rate and regular rhythm.     Pulses: Normal pulses.     Heart sounds: Normal  heart sounds. No murmur heard. Pulmonary:     Effort: Pulmonary effort is normal. No respiratory distress.     Breath sounds: Normal breath sounds. No wheezing.  Abdominal:     General: Abdomen is flat. Bowel sounds are normal. There is no distension.     Palpations: Abdomen is soft.     Tenderness: There is no abdominal tenderness.  Musculoskeletal:        General: No swelling or tenderness. Normal range of motion.     Cervical back: Full passive range of motion without pain, normal range of motion and neck supple.     Right lower leg: No edema.     Left lower leg: No edema.  Skin:    General: Skin is warm and dry.     Capillary Refill: Capillary refill takes less than 2 seconds.  Neurological:     General: No focal deficit present.     Mental Status: She is alert and oriented to person, place, and time.     Cranial Nerves: No cranial nerve  deficit.     Sensory: No sensory deficit.  Psychiatric:        Mood and Affect: Mood normal.        Behavior: Behavior normal.        Thought Content: Thought content normal.        Judgment: Judgment normal.         Assessment And Plan:     1. Encounter for general adult medical examination w/o abnormal findings Behavior modifications discussed and diet history reviewed.   Pt will continue to exercise regularly and modify diet with low GI, plant based foods and decrease intake of processed foods.  Recommend intake of daily multivitamin, Vitamin D, and calcium.  Recommend for preventive screenings, as well as recommend immunizations that include influenza, TDAP, and Shingles - CMP14+EGFR  2. BMI 26.0-26.9,adult - CBC  3. Encounter for screening for metabolic disorder - Hemoglobin A1c - Lipid panel  4. Encounter for vitamin deficiency screening - VITAMIN D 25 Hydroxy (Vit-D Deficiency, Fractures)    Patient was given opportunity to ask questions. Patient verbalized understanding of the plan and was able to repeat key elements of the plan. All questions were answered to their satisfaction.  Connie Felts, FNP   I, Connie Felts, FNP, have reviewed all documentation for this visit. The documentation on 08/16/22 for the exam, diagnosis, procedures, and orders are all accurate and complete.   IF YOU HAVE BEEN REFERRED TO A SPECIALIST, IT MAY TAKE 1-2 WEEKS TO SCHEDULE/PROCESS THE REFERRAL. IF YOU HAVE NOT HEARD FROM US/SPECIALIST IN TWO WEEKS, PLEASE GIVE [place] A CALL AT [phone].   THE PATIENT IS ENCOURAGED TO PRACTICE SOCIAL DISTANCING DUE TO THE COVID-19 PANDEMIC.

## 2022-08-17 LAB — CBC
Hematocrit: 36.8 % (ref 34.0–46.6)
Hemoglobin: 12.2 g/dL (ref 11.1–15.9)
MCH: 29.8 pg (ref 26.6–33.0)
MCHC: 33.2 g/dL (ref 31.5–35.7)
MCV: 90 fL (ref 79–97)
Platelets: 202 10*3/uL (ref 150–450)
RBC: 4.09 x10E6/uL (ref 3.77–5.28)
RDW: 13.2 % (ref 11.7–15.4)
WBC: 8.2 10*3/uL (ref 3.4–10.8)

## 2022-08-17 LAB — CMP14+EGFR
ALT: 12 IU/L (ref 0–32)
AST: 18 IU/L (ref 0–40)
Albumin/Globulin Ratio: 1.5 (ref 1.2–2.2)
Albumin: 4.2 g/dL (ref 3.9–4.9)
Alkaline Phosphatase: 38 IU/L — ABNORMAL LOW (ref 44–121)
BUN/Creatinine Ratio: 10 (ref 9–23)
BUN: 8 mg/dL (ref 6–20)
Bilirubin Total: 0.4 mg/dL (ref 0.0–1.2)
CO2: 21 mmol/L (ref 20–29)
Calcium: 9.5 mg/dL (ref 8.7–10.2)
Chloride: 104 mmol/L (ref 96–106)
Creatinine, Ser: 0.8 mg/dL (ref 0.57–1.00)
Globulin, Total: 2.8 g/dL (ref 1.5–4.5)
Glucose: 75 mg/dL (ref 70–99)
Potassium: 4.4 mmol/L (ref 3.5–5.2)
Sodium: 139 mmol/L (ref 134–144)
Total Protein: 7 g/dL (ref 6.0–8.5)
eGFR: 96 mL/min/{1.73_m2} (ref >59)

## 2022-08-17 LAB — LIPID PANEL
Chol/HDL Ratio: 1.6 ratio (ref 0.0–4.4)
Cholesterol, Total: 87 mg/dL — ABNORMAL LOW (ref 100–199)
HDL: 53 mg/dL (ref >39)
LDL Chol Calc (NIH): 23 mg/dL (ref 0–99)
Triglycerides: 36 mg/dL (ref 0–149)
VLDL Cholesterol Cal: 11 mg/dL (ref 5–40)

## 2022-08-17 LAB — VITAMIN D 25 HYDROXY (VIT D DEFICIENCY, FRACTURES): Vit D, 25-Hydroxy: 43.1 ng/mL (ref 30.0–100.0)

## 2022-08-17 LAB — HEMOGLOBIN A1C
Est. average glucose Bld gHb Est-mCnc: 108 mg/dL
Hgb A1c MFr Bld: 5.4 % (ref 4.8–5.6)

## 2022-08-23 DIAGNOSIS — F330 Major depressive disorder, recurrent, mild: Secondary | ICD-10-CM | POA: Diagnosis not present

## 2022-08-29 DIAGNOSIS — M25529 Pain in unspecified elbow: Secondary | ICD-10-CM | POA: Diagnosis not present

## 2022-09-06 DIAGNOSIS — M25529 Pain in unspecified elbow: Secondary | ICD-10-CM | POA: Diagnosis not present

## 2022-09-13 DIAGNOSIS — M79669 Pain in unspecified lower leg: Secondary | ICD-10-CM | POA: Diagnosis not present

## 2022-09-13 DIAGNOSIS — M25529 Pain in unspecified elbow: Secondary | ICD-10-CM | POA: Diagnosis not present

## 2022-09-20 DIAGNOSIS — M79669 Pain in unspecified lower leg: Secondary | ICD-10-CM | POA: Diagnosis not present

## 2022-09-20 DIAGNOSIS — M25529 Pain in unspecified elbow: Secondary | ICD-10-CM | POA: Diagnosis not present

## 2022-09-25 DIAGNOSIS — M79669 Pain in unspecified lower leg: Secondary | ICD-10-CM | POA: Diagnosis not present

## 2022-09-25 DIAGNOSIS — M25529 Pain in unspecified elbow: Secondary | ICD-10-CM | POA: Diagnosis not present

## 2022-10-03 DIAGNOSIS — M79669 Pain in unspecified lower leg: Secondary | ICD-10-CM | POA: Diagnosis not present

## 2022-10-03 DIAGNOSIS — M25529 Pain in unspecified elbow: Secondary | ICD-10-CM | POA: Diagnosis not present

## 2022-10-09 DIAGNOSIS — M25529 Pain in unspecified elbow: Secondary | ICD-10-CM | POA: Diagnosis not present

## 2022-10-09 DIAGNOSIS — M79669 Pain in unspecified lower leg: Secondary | ICD-10-CM | POA: Diagnosis not present

## 2022-10-16 DIAGNOSIS — M25529 Pain in unspecified elbow: Secondary | ICD-10-CM | POA: Diagnosis not present

## 2022-10-16 DIAGNOSIS — M79669 Pain in unspecified lower leg: Secondary | ICD-10-CM | POA: Diagnosis not present

## 2022-10-24 DIAGNOSIS — M79669 Pain in unspecified lower leg: Secondary | ICD-10-CM | POA: Diagnosis not present

## 2022-10-24 DIAGNOSIS — M25529 Pain in unspecified elbow: Secondary | ICD-10-CM | POA: Diagnosis not present

## 2022-10-30 DIAGNOSIS — M79669 Pain in unspecified lower leg: Secondary | ICD-10-CM | POA: Diagnosis not present

## 2022-10-30 DIAGNOSIS — M25529 Pain in unspecified elbow: Secondary | ICD-10-CM | POA: Diagnosis not present

## 2023-01-03 DIAGNOSIS — H6123 Impacted cerumen, bilateral: Secondary | ICD-10-CM | POA: Diagnosis not present

## 2023-01-17 DIAGNOSIS — M25529 Pain in unspecified elbow: Secondary | ICD-10-CM | POA: Diagnosis not present

## 2023-01-17 DIAGNOSIS — M79669 Pain in unspecified lower leg: Secondary | ICD-10-CM | POA: Diagnosis not present

## 2023-01-23 DIAGNOSIS — Z01419 Encounter for gynecological examination (general) (routine) without abnormal findings: Secondary | ICD-10-CM | POA: Diagnosis not present

## 2023-01-23 DIAGNOSIS — Z124 Encounter for screening for malignant neoplasm of cervix: Secondary | ICD-10-CM | POA: Diagnosis not present

## 2023-01-23 DIAGNOSIS — Z6825 Body mass index (BMI) 25.0-25.9, adult: Secondary | ICD-10-CM | POA: Diagnosis not present

## 2023-01-23 DIAGNOSIS — Z1151 Encounter for screening for human papillomavirus (HPV): Secondary | ICD-10-CM | POA: Diagnosis not present

## 2023-02-11 DIAGNOSIS — M79669 Pain in unspecified lower leg: Secondary | ICD-10-CM | POA: Diagnosis not present

## 2023-02-11 DIAGNOSIS — M25529 Pain in unspecified elbow: Secondary | ICD-10-CM | POA: Diagnosis not present

## 2023-02-25 DIAGNOSIS — M79669 Pain in unspecified lower leg: Secondary | ICD-10-CM | POA: Diagnosis not present

## 2023-02-25 DIAGNOSIS — M25529 Pain in unspecified elbow: Secondary | ICD-10-CM | POA: Diagnosis not present

## 2023-03-18 DIAGNOSIS — M940 Chondrocostal junction syndrome [Tietze]: Secondary | ICD-10-CM | POA: Diagnosis not present

## 2023-03-18 DIAGNOSIS — M9903 Segmental and somatic dysfunction of lumbar region: Secondary | ICD-10-CM | POA: Diagnosis not present

## 2023-03-18 DIAGNOSIS — M9901 Segmental and somatic dysfunction of cervical region: Secondary | ICD-10-CM | POA: Diagnosis not present

## 2023-03-18 DIAGNOSIS — M9905 Segmental and somatic dysfunction of pelvic region: Secondary | ICD-10-CM | POA: Diagnosis not present

## 2023-03-18 DIAGNOSIS — R0782 Intercostal pain: Secondary | ICD-10-CM | POA: Diagnosis not present

## 2023-03-18 DIAGNOSIS — M9907 Segmental and somatic dysfunction of upper extremity: Secondary | ICD-10-CM | POA: Diagnosis not present

## 2023-03-18 DIAGNOSIS — M9902 Segmental and somatic dysfunction of thoracic region: Secondary | ICD-10-CM | POA: Diagnosis not present

## 2023-03-27 DIAGNOSIS — M9901 Segmental and somatic dysfunction of cervical region: Secondary | ICD-10-CM | POA: Diagnosis not present

## 2023-03-27 DIAGNOSIS — M9903 Segmental and somatic dysfunction of lumbar region: Secondary | ICD-10-CM | POA: Diagnosis not present

## 2023-03-27 DIAGNOSIS — M9905 Segmental and somatic dysfunction of pelvic region: Secondary | ICD-10-CM | POA: Diagnosis not present

## 2023-03-27 DIAGNOSIS — M9907 Segmental and somatic dysfunction of upper extremity: Secondary | ICD-10-CM | POA: Diagnosis not present

## 2023-03-27 DIAGNOSIS — M940 Chondrocostal junction syndrome [Tietze]: Secondary | ICD-10-CM | POA: Diagnosis not present

## 2023-03-27 DIAGNOSIS — R0782 Intercostal pain: Secondary | ICD-10-CM | POA: Diagnosis not present

## 2023-03-27 DIAGNOSIS — M9902 Segmental and somatic dysfunction of thoracic region: Secondary | ICD-10-CM | POA: Diagnosis not present

## 2023-05-24 DIAGNOSIS — Z1231 Encounter for screening mammogram for malignant neoplasm of breast: Secondary | ICD-10-CM | POA: Diagnosis not present

## 2023-05-30 ENCOUNTER — Other Ambulatory Visit: Payer: Self-pay | Admitting: Obstetrics and Gynecology

## 2023-05-30 DIAGNOSIS — R928 Other abnormal and inconclusive findings on diagnostic imaging of breast: Secondary | ICD-10-CM

## 2023-06-15 ENCOUNTER — Other Ambulatory Visit: Payer: Self-pay | Admitting: Obstetrics and Gynecology

## 2023-06-15 ENCOUNTER — Ambulatory Visit
Admission: RE | Admit: 2023-06-15 | Discharge: 2023-06-15 | Disposition: A | Discharge status: Home / Self Care | Payer: BC Managed Care – PPO | Source: Ambulatory Visit | Attending: Obstetrics and Gynecology | Admitting: Obstetrics and Gynecology

## 2023-06-15 DIAGNOSIS — R928 Other abnormal and inconclusive findings on diagnostic imaging of breast: Secondary | ICD-10-CM

## 2023-06-15 DIAGNOSIS — N6321 Unspecified lump in the left breast, upper outer quadrant: Secondary | ICD-10-CM | POA: Diagnosis not present

## 2023-06-15 DIAGNOSIS — N6320 Unspecified lump in the left breast, unspecified quadrant: Secondary | ICD-10-CM

## 2023-06-15 DIAGNOSIS — N6310 Unspecified lump in the right breast, unspecified quadrant: Secondary | ICD-10-CM

## 2023-06-15 DIAGNOSIS — N6311 Unspecified lump in the right breast, upper outer quadrant: Secondary | ICD-10-CM | POA: Diagnosis not present

## 2023-07-04 ENCOUNTER — Ambulatory Visit (INDEPENDENT_AMBULATORY_CARE_PROVIDER_SITE_OTHER): Payer: BC Managed Care – PPO

## 2023-08-05 DIAGNOSIS — N911 Secondary amenorrhea: Secondary | ICD-10-CM | POA: Diagnosis not present

## 2023-08-16 DIAGNOSIS — O021 Missed abortion: Secondary | ICD-10-CM | POA: Diagnosis not present

## 2023-08-20 ENCOUNTER — Encounter: Payer: BC Managed Care – PPO | Admitting: Nurse Practitioner

## 2023-08-21 DIAGNOSIS — O021 Missed abortion: Secondary | ICD-10-CM | POA: Diagnosis not present

## 2023-08-27 NOTE — Progress Notes (Unsigned)
 [place], CMA,acting as a Neurosurgeon for Susanna Epley, FNP.,have documented all relevant documentation on the behalf of Susanna Epley, FNP,as directed by  Susanna Epley, FNP while in the presence of Susanna Epley, FNP.  Subjective:    Patient ID: Connie Snow , female    DOB: Dec 28, 1985 , 38 y.o.   MRN: [id number]  No chief complaint on file.   HPI  HPI   Past Medical History:  Diagnosis Date   HSV-1 (herpes simplex virus 1) infection      Family History  Problem Relation Age of Onset   Hypertension Mother    Diabetes Father      Current Outpatient Medications:    valACYclovir (VALTREX) 1000 MG tablet, Take 500 mg by mouth daily. As needed, Disp: , Rfl:    No Known Allergies    The patient states she uses {contraceptive methods:5051} for birth control. No LMP recorded.. {Dysmenorrhea-menorrhagia:21918}. Negative for: breast discharge, breast lump(s), breast pain and breast self exam. Associated symptoms include abnormal vaginal bleeding. Pertinent negatives include abnormal bleeding (hematology), anxiety, decreased libido, depression, difficulty falling sleep, dyspareunia, history of infertility, nocturia, sexual dysfunction, sleep disturbances, urinary incontinence, urinary urgency, vaginal discharge and vaginal itching. Diet regular.The patient states her exercise level is    . The patient's tobacco use is:  Social History   Tobacco Use  Smoking Status Never  Smokeless Tobacco Never  . She has been exposed to passive smoke. The patient's alcohol use is:  Social History   Substance and Sexual Activity  Alcohol Use No  . Additional information: Last pap ***, next one scheduled for ***.    Review of Systems   There were no vitals filed for this visit. There is no height or weight on file to calculate BMI.  Wt Readings from Last 3 Encounters:  08/16/22 164 lb 9.6 oz (74.7 kg)  08/14/21 167 lb (75.8 kg)  12/06/20 162 lb 9.6 oz (73.8 kg)     Objective:  Physical  Exam      Assessment And Plan:     Encounter for annual health examination     No follow-ups on file. Patient was given opportunity to ask questions. Patient verbalized understanding of the plan and was able to repeat key elements of the plan. All questions were answered to their satisfaction.   Susanna Epley, FNP  I, Susanna Epley, FNP, have reviewed all documentation for this visit. The documentation on 08/27/23 for the exam, diagnosis, procedures, and orders are all accurate and complete.

## 2023-08-28 ENCOUNTER — Ambulatory Visit (INDEPENDENT_AMBULATORY_CARE_PROVIDER_SITE_OTHER): Admitting: Nurse Practitioner

## 2023-08-28 VITALS — BP 120/70 | HR 95 | Temp 98.5°F | Ht 66.0 in | Wt 166.0 lb

## 2023-08-28 DIAGNOSIS — Z2821 Immunization not carried out because of patient refusal: Secondary | ICD-10-CM | POA: Insufficient documentation

## 2023-08-28 DIAGNOSIS — O039 Complete or unspecified spontaneous abortion without complication: Secondary | ICD-10-CM | POA: Diagnosis not present

## 2023-08-28 DIAGNOSIS — Z0000 Encounter for general adult medical examination without abnormal findings: Secondary | ICD-10-CM | POA: Diagnosis not present

## 2023-08-28 DIAGNOSIS — Z6826 Body mass index (BMI) 26.0-26.9, adult: Secondary | ICD-10-CM | POA: Diagnosis not present

## 2023-08-28 DIAGNOSIS — R519 Headache, unspecified: Secondary | ICD-10-CM | POA: Diagnosis not present

## 2023-08-28 DIAGNOSIS — Z3A08 8 weeks gestation of pregnancy: Secondary | ICD-10-CM | POA: Diagnosis not present

## 2023-08-28 NOTE — Assessment & Plan Note (Signed)
 Thought to be related to the hormonal changes with her recent pregnancy and miscarriage.

## 2023-08-28 NOTE — Assessment & Plan Note (Signed)
 Behavior modifications discussed and diet history reviewed.   Pt will continue to exercise regularly and modify diet with low GI, plant based foods and decrease intake of processed foods.  Recommend intake of daily multivitamin, Vitamin D , and calcium.  Recommend mammogram (she is to have repeat mammogram for 2 years every 6 months) for preventive screenings, as well as recommend immunizations that include influenza, TDAP

## 2023-08-28 NOTE — Assessment & Plan Note (Signed)
 Reports she was approximately [redacted] weeks pregnant and miscarried.  She took the oral pills for her D&C.  She has followed up with her OB provider today as well to check her levels.  Overall she is doing well

## 2023-08-28 NOTE — Assessment & Plan Note (Signed)

## 2023-08-29 LAB — CMP14+EGFR
ALT: 15 IU/L (ref 0–32)
AST: 20 IU/L (ref 0–40)
Albumin: 4.6 g/dL (ref 3.9–4.9)
Alkaline Phosphatase: 42 IU/L — ABNORMAL LOW (ref 44–121)
BUN/Creatinine Ratio: 11 (ref 9–23)
BUN: 8 mg/dL (ref 6–24)
Bilirubin Total: 0.3 mg/dL (ref 0.0–1.2)
CO2: 23 mmol/L (ref 20–29)
Calcium: 9.6 mg/dL (ref 8.7–10.2)
Chloride: 102 mmol/L (ref 96–106)
Creatinine, Ser: 0.74 mg/dL (ref 0.57–1.00)
Globulin, Total: 3.1 g/dL (ref 1.5–4.5)
Glucose: 88 mg/dL (ref 70–99)
Potassium: 4.4 mmol/L (ref 3.5–5.2)
Sodium: 139 mmol/L (ref 134–144)
Total Protein: 7.7 g/dL (ref 6.0–8.5)
eGFR: 105 mL/min/{1.73_m2} (ref >59)

## 2023-08-29 LAB — CBC WITH DIFFERENTIAL/PLATELET
Basophils Absolute: 0 10*3/uL (ref 0.0–0.2)
Basos: 0 %
EOS (ABSOLUTE): 0 10*3/uL (ref 0.0–0.4)
Eos: 0 %
Hematocrit: 35.1 % (ref 34.0–46.6)
Hemoglobin: 11.4 g/dL (ref 11.1–15.9)
Immature Grans (Abs): 0 10*3/uL (ref 0.0–0.1)
Immature Granulocytes: 0 %
Lymphocytes Absolute: 1.4 10*3/uL (ref 0.7–3.1)
Lymphs: 21 %
MCH: 29.3 pg (ref 26.6–33.0)
MCHC: 32.5 g/dL (ref 31.5–35.7)
MCV: 90 fL (ref 79–97)
Monocytes Absolute: 0.6 10*3/uL (ref 0.1–0.9)
Monocytes: 8 %
Neutrophils Absolute: 4.8 10*3/uL (ref 1.4–7.0)
Neutrophils: 71 %
Platelets: 277 10*3/uL (ref 150–450)
RBC: 3.89 x10E6/uL (ref 3.77–5.28)
RDW: 14.4 % (ref 11.7–15.4)
WBC: 6.8 10*3/uL (ref 3.4–10.8)

## 2023-08-29 LAB — LIPID PANEL
Chol/HDL Ratio: 1.6 ratio (ref 0.0–4.4)
Cholesterol, Total: 115 mg/dL (ref 100–199)
HDL: 70 mg/dL (ref >39)
LDL Chol Calc (NIH): 35 mg/dL (ref 0–99)
Triglycerides: 34 mg/dL (ref 0–149)
VLDL Cholesterol Cal: 10 mg/dL (ref 5–40)

## 2023-08-29 LAB — TSH: TSH: 1.06 u[IU]/mL (ref 0.450–4.500)

## 2023-09-04 ENCOUNTER — Ambulatory Visit (INDEPENDENT_AMBULATORY_CARE_PROVIDER_SITE_OTHER): Admitting: Otolaryngology

## 2023-09-04 ENCOUNTER — Encounter (INDEPENDENT_AMBULATORY_CARE_PROVIDER_SITE_OTHER): Payer: Self-pay | Admitting: Otolaryngology

## 2023-09-04 DIAGNOSIS — H6123 Impacted cerumen, bilateral: Secondary | ICD-10-CM

## 2023-09-04 DIAGNOSIS — R42 Dizziness and giddiness: Secondary | ICD-10-CM | POA: Diagnosis not present

## 2023-09-04 NOTE — Progress Notes (Signed)
 Patient ID: Connie Snow, female   DOB: Jan 10, 1986, 38 y.o.   MRN: [id number]  New complaint: Recurrent dizziness. Follow-up: Recurrent cerumen impaction  HPI: The patient is a 38 year old female who presents today with a new complaint of recurrent dizziness.  According to the patient, the dizziness is intermittent and infrequent.  She typically has 1-2 episodes a year.  She describes her dizziness as an off-balance and lightheaded sensation.  She denies any otalgia, otorrhea, tinnitus, or hearing loss.  The patient also has a history of recurrent cerumen impaction.  She has been returning on a regular basis for cerumen disimpaction.  Exam: General: Communicates without difficulty, well nourished, no acute distress. Head: Normocephalic, no evidence injury, no tenderness, facial buttresses intact without stepoff. Face/sinus: No tenderness to palpation and percussion. Facial movement is normal and symmetric. Eyes: PERRL, EOMI. No scleral icterus, conjunctivae clear. Neuro: CN II exam reveals vision grossly intact.  No nystagmus at any point of gaze. Ears: Auricles well formed without lesions.  Bilateral cerumen impaction.  Nose: External evaluation reveals normal support and skin without lesions.  Dorsum is intact.  Anterior rhinoscopy reveals congested mucosa over anterior aspect of inferior turbinates and intact septum.  No purulence noted. Oral:  Oral cavity and oropharynx are intact, symmetric, without erythema or edema.  Mucosa is moist without lesions. Neck: Full range of motion without pain.  There is no significant lymphadenopathy.  No masses palpable.  Thyroid  bed within normal limits to palpation.  Parotid glands and submandibular glands equal bilaterally without mass.  Trachea is midline. Neuro:  CN 2-12 grossly intact. Vestibular: No nystagmus at any point of gaze. Dix Hallpike negative. Vestibular: There is no nystagmus with pneumatic pressure on either tympanic membrane or Valsalva. The  cerebellar examination is unremarkable.    Procedure: Bilateral cerumen disimpaction Anesthesia: None Description: Under the operating microscope, the cerumen is carefully removed with a combination of cerumen currette, alligator forceps, and suction catheters.  After the cerumen is removed, the TMs are noted to be normal.  No mass, erythema, or lesions. The patient tolerated the procedure well.    Assessment: 1.  Recurrent dizziness of unknown etiology. The possible differential diagnoses include transient BPPV, vestibular migraine, Meniere's disease, peripheral vestibular dysfunction, or other central/systemic causes.   2.  Bilateral cerumen impaction.  After the disimpaction procedure, both tympanic membranes and middle ear spaces are noted to be normal.  No middle ear effusion is noted. 3.  Her Dix-Hallpike maneuver is negative.  Plan: 1.  Otomicroscopy with bilateral cerumen disimpaction. 2.  The pathophysiology of vestibular dysfunction and dizziness are discussed extensively with the patient. The possible differential diagnoses are reviewed. Questions are invited and answered.  3.  The treatment options of her dizziness are extensively discussed. 4.  Since the patient is currently asymptomatic, she would like to proceed with conservative observation for now. 5.  The patient will return for reevaluation in 6 months, sooner if needed.

## 2023-09-05 DIAGNOSIS — R42 Dizziness and giddiness: Secondary | ICD-10-CM | POA: Insufficient documentation

## 2023-09-10 DIAGNOSIS — O021 Missed abortion: Secondary | ICD-10-CM | POA: Diagnosis not present

## 2023-09-18 ENCOUNTER — Other Ambulatory Visit

## 2023-09-18 ENCOUNTER — Ambulatory Visit: Payer: Self-pay | Admitting: Nurse Practitioner

## 2023-09-27 DIAGNOSIS — R232 Flushing: Secondary | ICD-10-CM | POA: Diagnosis not present

## 2023-09-27 DIAGNOSIS — N952 Postmenopausal atrophic vaginitis: Secondary | ICD-10-CM | POA: Diagnosis not present

## 2023-11-01 ENCOUNTER — Encounter: Payer: Self-pay | Admitting: Advanced Practice Midwife

## 2023-11-18 ENCOUNTER — Other Ambulatory Visit

## 2023-12-02 DIAGNOSIS — F411 Generalized anxiety disorder: Secondary | ICD-10-CM | POA: Diagnosis not present

## 2023-12-17 ENCOUNTER — Ambulatory Visit
Admission: RE | Admit: 2023-12-17 | Discharge: 2023-12-17 | Disposition: A | Discharge status: Home / Self Care | Source: Ambulatory Visit | Attending: Obstetrics and Gynecology | Admitting: Obstetrics and Gynecology

## 2023-12-17 DIAGNOSIS — N6310 Unspecified lump in the right breast, unspecified quadrant: Secondary | ICD-10-CM

## 2023-12-17 DIAGNOSIS — N6320 Unspecified lump in the left breast, unspecified quadrant: Secondary | ICD-10-CM

## 2023-12-17 DIAGNOSIS — N6311 Unspecified lump in the right breast, upper outer quadrant: Secondary | ICD-10-CM | POA: Diagnosis not present

## 2023-12-17 DIAGNOSIS — N6324 Unspecified lump in the left breast, lower inner quadrant: Secondary | ICD-10-CM | POA: Diagnosis not present

## 2023-12-19 DIAGNOSIS — F411 Generalized anxiety disorder: Secondary | ICD-10-CM | POA: Diagnosis not present

## 2023-12-25 ENCOUNTER — Ambulatory Visit: Payer: Self-pay | Admitting: Nurse Practitioner

## 2023-12-25 ENCOUNTER — Encounter: Payer: Self-pay | Admitting: Nurse Practitioner

## 2023-12-25 ENCOUNTER — Ambulatory Visit: Attending: Nurse Practitioner

## 2023-12-25 VITALS — BP 110/60 | HR 91 | Temp 98.4°F | Ht 66.0 in | Wt 172.0 lb

## 2023-12-25 DIAGNOSIS — F411 Generalized anxiety disorder: Secondary | ICD-10-CM | POA: Diagnosis not present

## 2023-12-25 DIAGNOSIS — R002 Palpitations: Secondary | ICD-10-CM

## 2023-12-25 DIAGNOSIS — T148XXA Other injury of unspecified body region, initial encounter: Secondary | ICD-10-CM

## 2023-12-25 NOTE — Progress Notes (Unsigned)
 EP to read.

## 2023-12-25 NOTE — Progress Notes (Signed)
 I,Yameka J Llittleton, CMA,acting as a Neurosurgeon for [employer], FNP.,have documented all relevant documentation on the behalf of Gaines Ada, FNP,as directed by  Gaines Ada, FNP while in the presence of Gaines Ada, FNP.  Subjective:  Patient ID: Connie Snow , female    DOB: [birth year] , 38 y.o.   MRN: [id number]  Chief Complaint  Patient presents with   Palpitations    Patient presents today for palpitations. She reports she felt like she pulled a muscle in her chest. She reports she feels better.      HPI  Discussed the use of AI scribe software for clinical note transcription with the patient, who gave verbal consent to proceed.  History of Present Illness Connie Snow is a 38 year old female who presents with palpitations and chest muscle strain.  She experienced palpitations and a strained chest muscle on the left side, with excruciating pain, especially when her heart rate increased. The pain was aggravated by coughing, laughing, or any movement that increased her heart rate. Typically, such strains resolve in two to three days, but this episode lasted about five days.  The symptoms began after a heavy workout on Wednesday, with the pain starting on Thursday night. Initially, she thought it might be acid reflux. Despite the discomfort, she completed a full workout on Friday, including chest presses. By Friday night, the pain was fully present and persisted through the weekend, affecting her ability to sleep and engage in normal activities.  She has experienced palpitations in the past, but they were infrequent and not previously mentioned. Her sister also experiences palpitations and has undergone EKGs and possibly worn a telemetry monitor. She stays hydrated but acknowledges she could improve her water intake, especially given her frequent workouts.  No current chest pain, but sometimes palpitations are accompanied by a sensation similar to shortness of breath, which resolves  on its own.   Past Medical History:  Diagnosis Date   HSV-1 (herpes simplex virus 1) infection      Family History  Problem Relation Age of Onset   Hypertension Mother    Diabetes Father      Current Outpatient Medications:    valACYclovir (VALTREX) 1000 MG tablet, Take 500 mg by mouth daily. As needed, Disp: , Rfl:    ferrous sulfate  325 (65 FE) MG EC tablet, Take 1 tablet (325 mg total) by mouth daily with breakfast., Disp: 90 tablet, Rfl: 1   Allergies  Allergen Reactions   Ibuprofen Other (See Comments)    ibuprofen     Review of Systems  Constitutional: Negative.   Respiratory: Negative.    Cardiovascular:  Positive for palpitations.  Neurological: Negative.   Psychiatric/Behavioral: Negative.       Today's Vitals   12/25/23 1617  BP: 110/60  Pulse: 91  Temp: 98.4 F (36.9 C)  TempSrc: Oral  Weight: 172 lb (78 kg)  Height: 5' 6 (1.676 m)  PainSc: 0-No pain   Body mass index is 27.76 kg/m.  Wt Readings from Last 3 Encounters:  12/25/23 172 lb (78 kg)  08/28/23 166 lb (75.3 kg)  08/16/22 164 lb 9.6 oz (74.7 kg)     Objective:  Physical Exam Vitals and nursing note reviewed.  Constitutional:      General: She is not in acute distress.    Appearance: Normal appearance. She is well-developed.  HENT:     Head: Normocephalic and atraumatic.  Eyes:     Pupils: Pupils are equal, round, and reactive to  light.  Cardiovascular:     Rate and Rhythm: Normal rate and regular rhythm.     Pulses: Normal pulses.     Heart sounds: Normal heart sounds. No murmur heard. Pulmonary:     Effort: Pulmonary effort is normal. No respiratory distress.     Breath sounds: Normal breath sounds. No wheezing.  Musculoskeletal:        General: Normal range of motion.  Skin:    General: Skin is warm and dry.     Capillary Refill: Capillary refill takes less than 2 seconds.  Neurological:     General: No focal deficit present.     Mental Status: She is alert and oriented  to person, place, and time.     Cranial Nerves: No cranial nerve deficit.  Psychiatric:        Mood and Affect: Mood normal.       Assessment And Plan:  Pulled muscle Assessment & Plan: This has improved.    Palpitations Assessment & Plan: Intermittent palpitations with dyspnea. Family history of palpitations in twin sister. Possible dehydration and stress factors. No caffeine intake.  - Order EKG for baseline cardiac rhythm. EKG done with NSR HR 71 - Order CBC, iron levels, and thyroid  function tests. - Refer to cardiologist for evaluation. - Consider Zio patch for 14-day cardiac monitoring.  Orders: -     Ambulatory referral to Cardiology -     LONG TERM MONITOR (3-14 DAYS); Future -     Iron, TIBC and Ferritin Panel -     EKG 12-Lead -     CBC with Differential/Platelet -     TSH    Return if symptoms worsen or fail to improve.  Patient was given opportunity to ask questions. Patient verbalized understanding of the plan and was able to repeat key elements of the plan. All questions were answered to their satisfaction.    LILLETTE Gaines Ada, FNP, have reviewed all documentation for this visit. The documentation on 12/25/23 for the exam, diagnosis, procedures, and orders are all accurate and complete.   IF YOU HAVE BEEN REFERRED TO A SPECIALIST, IT MAY TAKE 1-2 WEEKS TO SCHEDULE/PROCESS THE REFERRAL. IF YOU HAVE NOT HEARD FROM US /SPECIALIST IN TWO WEEKS, PLEASE GIVE US  A CALL AT [phone].

## 2023-12-26 ENCOUNTER — Ambulatory Visit: Payer: Self-pay | Admitting: Nurse Practitioner

## 2023-12-26 LAB — CBC WITH DIFFERENTIAL/PLATELET
Basophils Absolute: 0 x10E3/uL (ref 0.0–0.2)
Basos: 1 %
EOS (ABSOLUTE): 0.1 x10E3/uL (ref 0.0–0.4)
Eos: 1 %
Hematocrit: 34.3 % (ref 34.0–46.6)
Hemoglobin: 10.6 g/dL — ABNORMAL LOW (ref 11.1–15.9)
Immature Grans (Abs): 0 x10E3/uL (ref 0.0–0.1)
Immature Granulocytes: 0 %
Lymphocytes Absolute: 2 x10E3/uL (ref 0.7–3.1)
Lymphs: 25 %
MCH: 27.4 pg (ref 26.6–33.0)
MCHC: 30.9 g/dL — ABNORMAL LOW (ref 31.5–35.7)
MCV: 89 fL (ref 79–97)
Monocytes Absolute: 0.8 x10E3/uL (ref 0.1–0.9)
Monocytes: 9 %
Neutrophils Absolute: 5.3 x10E3/uL (ref 1.4–7.0)
Neutrophils: 64 %
Platelets: 214 x10E3/uL (ref 150–450)
RBC: 3.87 x10E6/uL (ref 3.77–5.28)
RDW: 14.3 % (ref 11.7–15.4)
WBC: 8.3 x10E3/uL (ref 3.4–10.8)

## 2023-12-26 LAB — TSH: TSH: 1.33 u[IU]/mL (ref 0.450–4.500)

## 2023-12-26 LAB — IRON,TIBC AND FERRITIN PANEL
Ferritin: 12 ng/mL — ABNORMAL LOW (ref 15–150)
Iron Saturation: 21 % (ref 15–55)
Iron: 86 ug/dL (ref 27–159)
Total Iron Binding Capacity: 415 ug/dL (ref 250–450)
UIBC: 329 ug/dL (ref 131–425)

## 2023-12-26 MED ORDER — FERROUS SULFATE 325 (65 FE) MG PO TBEC: 325.0000 mg | DELAYED_RELEASE_TABLET | Freq: Every day | ORAL | 1 refills | Status: AC

## 2024-01-05 DIAGNOSIS — R002 Palpitations: Secondary | ICD-10-CM | POA: Insufficient documentation

## 2024-01-05 DIAGNOSIS — T148XXA Other injury of unspecified body region, initial encounter: Secondary | ICD-10-CM | POA: Insufficient documentation

## 2024-01-05 NOTE — Assessment & Plan Note (Signed)
 This has improved.

## 2024-01-05 NOTE — Assessment & Plan Note (Addendum)
 Intermittent palpitations with dyspnea. Family history of palpitations in twin sister. Possible dehydration and stress factors. No caffeine intake.  - Order EKG for baseline cardiac rhythm. EKG done with NSR HR 71 - Order CBC, iron levels, and thyroid  function tests. - Refer to cardiologist for evaluation. - Consider Zio patch for 14-day cardiac monitoring.

## 2024-01-08 DIAGNOSIS — F411 Generalized anxiety disorder: Secondary | ICD-10-CM | POA: Diagnosis not present

## 2024-01-15 DIAGNOSIS — F411 Generalized anxiety disorder: Secondary | ICD-10-CM | POA: Diagnosis not present

## 2024-01-27 DIAGNOSIS — Z1151 Encounter for screening for human papillomavirus (HPV): Secondary | ICD-10-CM | POA: Diagnosis not present

## 2024-01-27 DIAGNOSIS — R399 Unspecified symptoms and signs involving the genitourinary system: Secondary | ICD-10-CM | POA: Diagnosis not present

## 2024-01-27 DIAGNOSIS — Z01419 Encounter for gynecological examination (general) (routine) without abnormal findings: Secondary | ICD-10-CM | POA: Diagnosis not present

## 2024-01-27 DIAGNOSIS — N771 Vaginitis, vulvitis and vulvovaginitis in diseases classified elsewhere: Secondary | ICD-10-CM | POA: Diagnosis not present

## 2024-01-27 DIAGNOSIS — Z124 Encounter for screening for malignant neoplasm of cervix: Secondary | ICD-10-CM | POA: Diagnosis not present

## 2024-01-27 DIAGNOSIS — Z6826 Body mass index (BMI) 26.0-26.9, adult: Secondary | ICD-10-CM | POA: Diagnosis not present

## 2024-01-27 DIAGNOSIS — N760 Acute vaginitis: Secondary | ICD-10-CM | POA: Diagnosis not present

## 2024-02-05 DIAGNOSIS — F411 Generalized anxiety disorder: Secondary | ICD-10-CM | POA: Diagnosis not present

## 2024-02-12 DIAGNOSIS — F411 Generalized anxiety disorder: Secondary | ICD-10-CM | POA: Diagnosis not present

## 2024-02-19 DIAGNOSIS — F411 Generalized anxiety disorder: Secondary | ICD-10-CM | POA: Diagnosis not present

## 2024-02-26 DIAGNOSIS — F411 Generalized anxiety disorder: Secondary | ICD-10-CM | POA: Diagnosis not present

## 2024-03-02 ENCOUNTER — Encounter (INDEPENDENT_AMBULATORY_CARE_PROVIDER_SITE_OTHER): Payer: Self-pay | Admitting: Otolaryngology

## 2024-03-02 ENCOUNTER — Ambulatory Visit (INDEPENDENT_AMBULATORY_CARE_PROVIDER_SITE_OTHER): Admitting: Otolaryngology

## 2024-03-02 VITALS — BP 103/69 | HR 80 | Temp 98.8°F | Ht 68.0 in | Wt 165.0 lb

## 2024-03-02 DIAGNOSIS — R42 Dizziness and giddiness: Secondary | ICD-10-CM | POA: Diagnosis not present

## 2024-03-02 DIAGNOSIS — H6123 Impacted cerumen, bilateral: Secondary | ICD-10-CM | POA: Diagnosis not present

## 2024-03-02 NOTE — Progress Notes (Unsigned)
 Patient ID: Connie Snow, female   DOB: Jul 31, 1985, 38 y.o.   MRN: [id number]  Follow-up: Recurrent dizziness  HPI: The patient is a 38 year old female who returns today for her follow-up evaluation.  She was last seen in May 2025.  At that time, she was complaining of recurrent dizziness.  The cause of her dizziness was unclear.  The treatment options for her dizziness were discussed.  The patient returns today reporting 1 episode of dizziness 2 months ago.  The symptoms include spinning vertigo that lasted for several hours and nausea.  She denies any vomiting or falls.  The patient also complains of clogging sensation in her ears.  Currently she denies any otalgia or otorrhea.  Exam: General: Communicates without difficulty, well nourished, no acute distress. Head: Normocephalic, no evidence injury, no tenderness, facial buttresses intact without stepoff. Face/sinus: No tenderness to palpation and percussion. Facial movement is normal and symmetric. Eyes: PERRL, EOMI. No scleral icterus, conjunctivae clear. Neuro: CN II exam reveals vision grossly intact.  No nystagmus at any point of gaze. Ears: Auricles well formed without lesions.  Bilateral cerumen impaction.  Nose: External evaluation reveals normal support and skin without lesions.  Dorsum is intact.  Anterior rhinoscopy reveals congested mucosa over anterior aspect of inferior turbinates and intact septum.  No purulence noted. Oral:  Oral cavity and oropharynx are intact, symmetric, without erythema or edema.  Mucosa is moist without lesions. Neck: Full range of motion without pain.  There is no significant lymphadenopathy.  No masses palpable.  Thyroid  bed within normal limits to palpation.  Parotid glands and submandibular glands equal bilaterally without mass.  Trachea is midline. Neuro:  CN 2-12 grossly intact.   Procedure: Bilateral cerumen disimpaction Anesthesia: None Description: Under the operating microscope, the cerumen is carefully  removed with a combination of cerumen currette, alligator forceps, and suction catheters.  After the cerumen is removed, the TMs are noted to be normal.  No mass, erythema, or lesions. The patient tolerated the procedure well.    Assessment: 1.  Recurrent dizziness of unknown etiology.  She had 1 episode of dizziness over the past 6 months. The possible differential diagnoses include transient BPPV, vestibular migraine, Meniere's disease, peripheral vestibular dysfunction, or other central/systemic causes.   2.  Bilateral recurrent cerumen impaction.  After the disimpaction procedure, both tympanic membranes and middle ear spaces were noted to be normal. 3.  The rest of her ENT exam is normal.  Plan: 1.  Otomicroscopy with bilateral cerumen disimpaction. 2.  The physical exam findings are reviewed with the patient. 3.  Since the patient is currently asymptomatic, the decision is made to proceed with conservative observation in regards to her dizziness. 4.  The patient will return for reevaluation in 5 months.

## 2024-03-04 DIAGNOSIS — F411 Generalized anxiety disorder: Secondary | ICD-10-CM | POA: Diagnosis not present

## 2024-03-25 DIAGNOSIS — F411 Generalized anxiety disorder: Secondary | ICD-10-CM | POA: Diagnosis not present

## 2024-04-06 DIAGNOSIS — F411 Generalized anxiety disorder: Secondary | ICD-10-CM | POA: Diagnosis not present

## 2024-05-05 ENCOUNTER — Encounter: Payer: Self-pay | Admitting: Obstetrics and Gynecology

## 2024-05-05 ENCOUNTER — Other Ambulatory Visit: Payer: Self-pay | Admitting: Obstetrics and Gynecology

## 2024-05-05 DIAGNOSIS — N63 Unspecified lump in unspecified breast: Secondary | ICD-10-CM

## 2024-06-17 ENCOUNTER — Other Ambulatory Visit: Payer: Self-pay

## 2024-08-04 ENCOUNTER — Ambulatory Visit (INDEPENDENT_AMBULATORY_CARE_PROVIDER_SITE_OTHER): Admitting: Otolaryngology

## 2024-08-31 ENCOUNTER — Encounter: Payer: Self-pay | Admitting: Nurse Practitioner
